# Patient Record
Sex: Female | Born: 1965 | Race: Black or African American | Hispanic: No | Smoking: Former smoker
Health system: Southern US, Community
[De-identification: ages and names within clinical notes are randomized; demographics above are authoritative.]

## PROBLEM LIST (undated history)

## (undated) DIAGNOSIS — F32A Depression, unspecified: Secondary | ICD-10-CM

## (undated) DIAGNOSIS — F419 Anxiety disorder, unspecified: Secondary | ICD-10-CM

## (undated) DIAGNOSIS — Z789 Other specified health status: Secondary | ICD-10-CM

## (undated) DIAGNOSIS — M810 Age-related osteoporosis without current pathological fracture: Secondary | ICD-10-CM

## (undated) DIAGNOSIS — E119 Type 2 diabetes mellitus without complications: Secondary | ICD-10-CM

## (undated) DIAGNOSIS — M1990 Unspecified osteoarthritis, unspecified site: Secondary | ICD-10-CM

## (undated) DIAGNOSIS — I10 Essential (primary) hypertension: Secondary | ICD-10-CM

## (undated) HISTORY — DX: Type 2 diabetes mellitus without complications: E11.9

## (undated) HISTORY — DX: Depression, unspecified: F32.A

## (undated) HISTORY — PX: TUBAL LIGATION: SHX77

## (undated) HISTORY — DX: Essential (primary) hypertension: I10

## (undated) HISTORY — DX: Age-related osteoporosis without current pathological fracture: M81.0

## (undated) HISTORY — DX: Anxiety disorder, unspecified: F41.9

---

## 2005-03-03 ENCOUNTER — Encounter: Payer: Self-pay | Admitting: Family Medicine

## 2005-03-16 ENCOUNTER — Encounter: Payer: Self-pay | Admitting: Family Medicine

## 2005-04-16 ENCOUNTER — Encounter: Payer: Self-pay | Admitting: Family Medicine

## 2009-12-24 ENCOUNTER — Emergency Department: Payer: Self-pay | Admitting: Emergency Medicine

## 2010-11-17 ENCOUNTER — Emergency Department: Payer: Self-pay | Admitting: Emergency Medicine

## 2011-01-24 ENCOUNTER — Emergency Department: Payer: Self-pay | Admitting: Emergency Medicine

## 2011-04-28 ENCOUNTER — Emergency Department: Payer: Self-pay | Admitting: Emergency Medicine

## 2011-10-12 ENCOUNTER — Emergency Department: Payer: Self-pay | Admitting: Emergency Medicine

## 2012-01-24 ENCOUNTER — Emergency Department: Payer: Self-pay | Admitting: Emergency Medicine

## 2013-08-19 ENCOUNTER — Emergency Department: Payer: Self-pay | Admitting: Emergency Medicine

## 2014-09-17 ENCOUNTER — Emergency Department: Payer: Self-pay | Admitting: Emergency Medicine

## 2014-09-17 LAB — CBC WITH DIFFERENTIAL/PLATELET
BASOS ABS: 0.1 10*3/uL (ref 0.0–0.1)
Basophil %: 0.5 %
EOS PCT: 1.6 %
Eosinophil #: 0.2 10*3/uL (ref 0.0–0.7)
HCT: 38.5 % (ref 35.0–47.0)
HGB: 12.6 g/dL (ref 12.0–16.0)
Lymphocyte #: 2.5 10*3/uL (ref 1.0–3.6)
Lymphocyte %: 22.5 %
MCH: 29.7 pg (ref 26.0–34.0)
MCHC: 32.6 g/dL (ref 32.0–36.0)
MCV: 91 fL (ref 80–100)
Monocyte #: 0.5 x10 3/mm (ref 0.2–0.9)
Monocyte %: 4.6 %
NEUTROS PCT: 70.8 %
Neutrophil #: 7.7 10*3/uL — ABNORMAL HIGH (ref 1.4–6.5)
Platelet: 204 10*3/uL (ref 150–440)
RBC: 4.22 10*6/uL (ref 3.80–5.20)
RDW: 14.7 % — ABNORMAL HIGH (ref 11.5–14.5)
WBC: 10.9 10*3/uL (ref 3.6–11.0)

## 2014-09-17 LAB — BASIC METABOLIC PANEL
ANION GAP: 7 (ref 7–16)
BUN: 6 mg/dL — ABNORMAL LOW (ref 7–18)
CREATININE: 0.83 mg/dL (ref 0.60–1.30)
Calcium, Total: 8.4 mg/dL — ABNORMAL LOW (ref 8.5–10.1)
Chloride: 109 mmol/L — ABNORMAL HIGH (ref 98–107)
Co2: 25 mmol/L (ref 21–32)
EGFR (African American): >60
Glucose: 93 mg/dL (ref 65–99)
OSMOLALITY: 279 (ref 275–301)
POTASSIUM: 4.3 mmol/L (ref 3.5–5.1)
SODIUM: 141 mmol/L (ref 136–145)

## 2014-09-17 LAB — URINALYSIS, COMPLETE
BILIRUBIN, UR: NEGATIVE
Blood: NEGATIVE
Glucose,UR: NEGATIVE mg/dL (ref 0–75)
Ketone: NEGATIVE
Leukocyte Esterase: NEGATIVE
Nitrite: NEGATIVE
PH: 7 (ref 4.5–8.0)
PROTEIN: NEGATIVE
Specific Gravity: 1.01 (ref 1.003–1.030)

## 2014-09-17 LAB — PREGNANCY, URINE: Pregnancy Test, Urine: NEGATIVE m[IU]/mL

## 2014-12-04 ENCOUNTER — Emergency Department: Payer: Self-pay | Admitting: Emergency Medicine

## 2014-12-30 ENCOUNTER — Emergency Department: Payer: Self-pay | Admitting: Student

## 2015-06-09 ENCOUNTER — Emergency Department
Admission: EM | Admit: 2015-06-09 | Discharge: 2015-06-09 | Disposition: A | Discharge status: Home / Self Care | Payer: Self-pay | Attending: Emergency Medicine | Admitting: Emergency Medicine

## 2015-06-09 ENCOUNTER — Emergency Department: Payer: Self-pay

## 2015-06-09 ENCOUNTER — Encounter: Payer: Self-pay | Admitting: Emergency Medicine

## 2015-06-09 DIAGNOSIS — J209 Acute bronchitis, unspecified: Secondary | ICD-10-CM | POA: Insufficient documentation

## 2015-06-09 DIAGNOSIS — Z720 Tobacco use: Secondary | ICD-10-CM | POA: Insufficient documentation

## 2015-06-09 DIAGNOSIS — F17200 Nicotine dependence, unspecified, uncomplicated: Secondary | ICD-10-CM

## 2015-06-09 HISTORY — DX: Other specified health status: Z78.9

## 2015-06-09 MED ORDER — IPRATROPIUM-ALBUTEROL 0.5-2.5 (3) MG/3ML IN SOLN
3.0000 mL | Freq: Once | RESPIRATORY_TRACT | Status: AC
  Administered 2015-06-09: 3 mL via RESPIRATORY_TRACT
  Filled 2015-06-09: qty 3

## 2015-06-09 MED ORDER — ALBUTEROL SULFATE HFA 108 (90 BASE) MCG/ACT IN AERS: 2.0000 | INHALATION_SPRAY | Freq: Four times a day (QID) | RESPIRATORY_TRACT | Status: DC | PRN

## 2015-06-09 MED ORDER — PREDNISONE 10 MG PO TABS: ORAL_TABLET | ORAL | Status: DC

## 2015-06-09 MED ORDER — AZITHROMYCIN 250 MG PO TABS: ORAL_TABLET | ORAL | Status: DC

## 2015-06-09 NOTE — ED Notes (Signed)
PT here with s/s of upper respiratory congestion. Has stuffy nose, congestion.  Denies fevers.  Pt reports that she has a "mold problem" at her apartment.

## 2015-06-09 NOTE — ED Notes (Signed)
Pt c/o cold symptoms, congestion, cough with green sputum, pressure in bilat sinuses. States she has mold in her apartment, and starting feeling bad two weeks ago and worsened Friday. She has taken Allegra D and alkla seltzer cold without relief.

## 2015-06-09 NOTE — ED Notes (Signed)
AAOx3.  Skin warm and dry. No SOB/ DOE.  D/C home. 

## 2015-06-09 NOTE — ED Provider Notes (Signed)
[facility] Emergency Department Provider Note  ____________________________________________  Time seen:  10:20 AM  I have reviewed the triage vital signs and the nursing notes.   HISTORY  Chief Complaint URI and Cough   HPI Denise Velez is a 49 y.o. female is here with complaint of productive cough, nasal congestion and bilateral sinus pain for approximately 2 weeks. She states that she began taking Allegra-D approximate 4 days ago and some Alka-Seltzer cold without any relief. She is unsure of any fever at home. By history she is a smoker but states that instead of smoking half pack cigarettes a day she is only smoking one or 2 cigarettes now. She denies any history of asthma or pneumonia. She is a patient currently at [facility] but has not seen them for this.She also reports that there is a "mold problem" at her apartment   Past Medical History  Diagnosis Date  . Patient denies medical problems     There are no active problems to display for this patient.   Past Surgical History  Procedure Laterality Date  . Cesarean section      Current Outpatient Rx  Name  Route  Sig  Dispense  Refill  . albuterol (PROVENTIL HFA;VENTOLIN HFA) 108 (90 BASE) MCG/ACT inhaler   Inhalation   Inhale 2 puffs into the lungs every 6 (six) hours as needed for wheezing or shortness of breath.   1 Inhaler   2   . azithromycin (ZITHROMAX Z-PAK) 250 MG tablet      Take 2 tablets (500 mg) on  Day 1,  followed by 1 tablet (250 mg) once daily on Days 2 through 5.   6 each   0   . predniSONE (DELTASONE) 10 MG tablet      Take 6 tablets  today, on day 2 take 5 tablets, day 3 take 4 tablets, day 4 take 3 tablets, day 5 take  2 tablets and 1 tablet the last day   21 tablet   0     Allergies Sulfa antibiotics  History reviewed. No pertinent family history.  Social History History  Substance Use Topics  . Smoking status: Current Every Day Smoker -- 0.50  packs/day    Types: Cigarettes  . Smokeless tobacco: Not on file  . Alcohol Use: No    Review of Systems Constitutional: Negative fever/chills Eyes: No visual changes. ENT: No sore throat. Positive for nasal drainage Cardiovascular: Denies chest pain. Respiratory: Denies shortness of breath. Positive productive cough Gastrointestinal: No abdominal pain.  No nausea, no vomiting.  No diarrhea.  No constipation. Genitourinary: Negative for dysuria. Musculoskeletal: Negative for back pain. Skin: Negative for rash. Neurological: Negative for headaches, focal weakness or numbness.  10-point ROS otherwise negative.  ____________________________________________   PHYSICAL EXAM:  VITAL SIGNS: ED Triage Vitals  Enc Vitals Group     BP 06/09/15 0858 142/84 mmHg     Pulse Rate 06/09/15 0858 92     Resp 06/09/15 0858 20     Temp 06/09/15 0858 98.1 F (36.7 C)     Temp Source 06/09/15 0858 Oral     SpO2 06/09/15 0858 98 %     Weight 06/09/15 0858 225 lb (102.059 kg)     Height 06/09/15 0858  (1.702 m)     Head Cir --      Peak Flow --      Pain Score --      Pain Loc --  Pain Edu? --      Excl. in GC? --     Constitutional: Alert and oriented. Well appearing and in no acute distress. Eyes: Conjunctivae are normal. PERRL. EOMI. Head: Atraumatic. Nose: Positive congestion/negative rhinnorhea. Mouth/Throat: Mucous membranes are moist.  Oropharynx non-erythematous. Neck: No stridor.  Supple Hematological/Lymphatic/Immunilogical: No cervical lymphadenopathy. Cardiovascular: Normal rate, regular rhythm. Grossly normal heart sounds.  Good peripheral circulation. Respiratory: Normal respiratory effort.  No retractions. Lungs CTAB with coarse cough. Gastrointestinal: Soft and nontender. No distention Musculoskeletal: No lower extremity tenderness nor edema.  No joint effusions. Neurologic:  Normal speech and language. No gross focal neurologic deficits are appreciated. No  gait instability. Skin:  Skin is warm, dry and intact. No rash noted. Psychiatric: Mood and affect are normal. Speech and behavior are normal.  ____________________________________________   LABS (all labs ordered are listed, but only abnormal results are displayed)  Labs Reviewed - No data to display    ____________________________________________  RADIOLOGY  Chest x-ray per radiologist and reviewed by me was negative for pneumonia I, Tommi Rumps, personally viewed and evaluated these images as part of my medical decision making.  ____________________________________________   PROCEDURES  Procedure(s) performed: None  Critical Care performed: No  ____________________________________________   INITIAL IMPRESSION / ASSESSMENT AND PLAN / ED COURSE  Pertinent labs & imaging results that were available during my care of the patient were reviewed by me and considered in my medical decision making (see chart for details   Patient was started on azithromycin for 5 days. She is also given a prescription for prednisone taper and an albuterol inhaler. She is encouraged to stop smoking. She is take Tylenol as needed. She is also to continue Allegra-D for congestion   FINAL CLINICAL IMPRESSION(S) / ED DIAGNOSES  Final diagnoses:  Acute bronchitis, unspecified organism  Current smoker      Tommi Rumps, PA-C 06/09/15 1800  Governor Rooks, MD 06/12/15 [phone]

## 2015-06-09 NOTE — Discharge Instructions (Signed)
Acute Bronchitis Bronchitis is when the airways that extend from the windpipe into the lungs get red, puffy, and painful (inflamed). Bronchitis often causes thick spit (mucus) to develop. This leads to a cough. A cough is the most common symptom of bronchitis. In acute bronchitis, the condition usually begins suddenly and goes away over time (usually in 2 weeks). Smoking, allergies, and asthma can make bronchitis worse. Repeated episodes of bronchitis may cause more lung problems. HOME CARE  Rest.  Drink enough fluids to keep your pee (urine) clear or pale yellow (unless you need to limit fluids as told by your doctor).  Only take over-the-counter or prescription medicines as told by your doctor.  Avoid smoking and secondhand smoke. These can make bronchitis worse. If you are a smoker, think about using nicotine gum or skin patches. Quitting smoking will help your lungs heal faster.  Reduce the chance of getting bronchitis again by:  Washing your hands often.  Avoiding people with cold symptoms.  Trying not to touch your hands to your mouth, nose, or eyes.  Follow up with your doctor as told. GET HELP IF: Your symptoms do not improve after 1 week of treatment. Symptoms include:  Cough.  Fever.  Coughing up thick spit.  Body aches.  Chest congestion.  Chills.  Shortness of breath.  Sore throat. GET HELP RIGHT AWAY IF:   You have an increased fever.  You have chills.  You have severe shortness of breath.  You have bloody thick spit (sputum).  You throw up (vomit) often.  You lose too much body fluid (dehydration).  You have a severe headache.  You faint. MAKE SURE YOU:   Understand these instructions.  Will watch your condition.  Will get help right away if you are not doing well or get worse. Document Released: 04/20/2008 Document Revised: 07/05/2013 Document Reviewed: 04/25/2013 Exit[employer] Patient Information 2015 [employer]. This information is not  intended to replace advice given to you by your health care provider. Make sure you discuss any questions you have with your health care provider.  

## 2016-06-15 ENCOUNTER — Emergency Department
Admission: EM | Admit: 2016-06-15 | Discharge: 2016-06-15 | Disposition: A | Discharge status: Home / Self Care | Payer: Self-pay | Attending: Emergency Medicine | Admitting: Emergency Medicine

## 2016-06-15 ENCOUNTER — Encounter: Payer: Self-pay | Admitting: Emergency Medicine

## 2016-06-15 ENCOUNTER — Emergency Department: Payer: Self-pay

## 2016-06-15 DIAGNOSIS — Z79899 Other long term (current) drug therapy: Secondary | ICD-10-CM | POA: Insufficient documentation

## 2016-06-15 DIAGNOSIS — Y999 Unspecified external cause status: Secondary | ICD-10-CM | POA: Insufficient documentation

## 2016-06-15 DIAGNOSIS — F17210 Nicotine dependence, cigarettes, uncomplicated: Secondary | ICD-10-CM | POA: Insufficient documentation

## 2016-06-15 DIAGNOSIS — Y929 Unspecified place or not applicable: Secondary | ICD-10-CM | POA: Insufficient documentation

## 2016-06-15 DIAGNOSIS — S46911A Strain of unspecified muscle, fascia and tendon at shoulder and upper arm level, right arm, initial encounter: Secondary | ICD-10-CM | POA: Insufficient documentation

## 2016-06-15 DIAGNOSIS — X500XXA Overexertion from strenuous movement or load, initial encounter: Secondary | ICD-10-CM | POA: Insufficient documentation

## 2016-06-15 DIAGNOSIS — Y9389 Activity, other specified: Secondary | ICD-10-CM | POA: Insufficient documentation

## 2016-06-15 MED ORDER — KETOROLAC TROMETHAMINE 60 MG/2ML IM SOLN
60.0000 mg | Freq: Once | INTRAMUSCULAR | Status: AC
  Administered 2016-06-15: 60 mg via INTRAMUSCULAR
  Filled 2016-06-15: qty 2

## 2016-06-15 MED ORDER — TRAMADOL HCL 50 MG PO TABS: 50.0000 mg | ORAL_TABLET | Freq: Four times a day (QID) | ORAL | 0 refills | Status: AC | PRN

## 2016-06-15 MED ORDER — METHOCARBAMOL 750 MG PO TABS: 1500.0000 mg | ORAL_TABLET | Freq: Four times a day (QID) | ORAL | 0 refills | Status: DC

## 2016-06-15 MED ORDER — KETOROLAC TROMETHAMINE 10 MG PO TABS: 10.0000 mg | ORAL_TABLET | Freq: Four times a day (QID) | ORAL | 0 refills | Status: DC | PRN

## 2016-06-15 NOTE — ED Notes (Signed)
Pt c/o of right shoulder pain beginning Saturday. 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   Pt reports she took 1000 mg tylenol at approx 1800

## 2016-06-15 NOTE — Discharge Instructions (Signed)
Wear arm sling for 3-5 days while awake.

## 2016-06-15 NOTE — ED Notes (Signed)
  Reviewed d/c instructions, follow-up care, and prescriptions with pt. Pt verbalized understanding 

## 2016-06-15 NOTE — ED Triage Notes (Signed)
Patient states that she has been doing some heavy lifting and developed right shoulder pain 2 days ago, that has become worse today.

## 2016-06-15 NOTE — ED Notes (Signed)
Pt reports some relief of pain when she holds arm at right angle

## 2016-06-15 NOTE — ED Provider Notes (Signed)
[facility] Emergency Department Provider Note   ____________________________________________   First MD Initiated Contact with Patient 06/15/16 2031     (approximate)  I have reviewed the triage vital signs and the nursing notes.   HISTORY  Chief Complaint Shoulder Pain    HPI Denise Velez is a 50 y.o. female patient complaining of posterior upper shoulder painfor 2 days. 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 Patient states she felt a pull in the right posterior shoulder 2 days ago and pain is worsened today. Patient has used icy hot and Tylenol with no measurable fever relief. Patient denies any loss of sensation. Patient state increased pain with adduction and overhead reaching. Patient rates the pain as a 9/10. Patient described a pain as aching intermittently episodes of "sharp". No other palliative measures for this complaint. Patient is right-hand dominant.   Past Medical History:  Diagnosis Date  . Patient denies medical problems     There are no active problems to display for this patient.   Past Surgical History:  Procedure Laterality Date  . CESAREAN SECTION     times 2    Prior to Admission medications   Medication Sig Start Date End Date Taking? Authorizing Provider  albuterol (PROVENTIL HFA;VENTOLIN HFA) 108 (90 BASE) MCG/ACT inhaler Inhale 2 puffs into the lungs every 6 (six) hours as needed for wheezing or shortness of breath. 06/09/15   Tommi Rumps, PA-C  azithromycin (ZITHROMAX Z-PAK) 250 MG tablet Take 2 tablets (500 mg) on  Day 1,  followed by 1 tablet (250 mg) once daily on Days 2 through 5. 06/09/15   Tommi Rumps, PA-C  ketorolac (TORADOL) 10 MG tablet Take 1 tablet (10 mg total) by mouth every 6 (six) hours as needed. 06/15/16   Joni Reining, PA-C  methocarbamol (ROBAXIN-750) 750 MG tablet Take 2 tablets (1,500 mg total) by mouth 4 (four) times daily. 06/15/16   Joni Reining, PA-C  predniSONE (DELTASONE) 10 MG tablet Take 6 tablets  today, on day 2 take 5 tablets, day 3 take 4 tablets, day 4 take 3 tablets, day 5 take  2 tablets and 1 tablet the last day 06/09/15   Tommi Rumps, PA-C  traMADol (ULTRAM) 50 MG tablet Take 1 tablet (50 mg total) by mouth every 6 (six) hours as needed. 06/15/16 06/15/17  Joni Reining, PA-C    Allergies Sulfa antibiotics  No family history on file.  Social History Social History  Substance Use Topics  . Smoking status: Current Every Day Smoker    Packs/day: 0.50    Types: Cigarettes  . Smokeless tobacco: Never Used  . Alcohol use Yes     Comment: occasionaly    Review of Systems Constitutional: No fever/chills Eyes: No visual changes. ENT: No sore throat. Cardiovascular: Denies chest pain. Respiratory: Denies shortness of breath. Gastrointestinal: No abdominal pain.  No nausea, no vomiting.  No diarrhea.  No constipation. Genitourinary: Negative for dysuria. Musculoskeletal: Positive right posterior shoulder Skin: Negative for rash. Neurological: Negative for headaches, focal weakness or numbness. Allergic/Immunilogical: Sulfa antibiotics   ____________________________________________   PHYSICAL EXAM:  VITAL SIGNS: ED Triage Vitals  Enc Vitals Group     BP 06/15/16 1920 (!) 166/99     Pulse Rate 06/15/16 1920 92     Resp 06/15/16 1920 18     Temp 06/15/16 1920 98.6 F (37 C)     Temp Source  06/15/16 1920 Oral     SpO2 06/15/16 1920 98 %     Weight 06/15/16 1920 225 lb (102.1 kg)     Height 06/15/16 1920  (1.702 m)     Head Circumference --      Peak Flow --      Pain Score 06/15/16 1925 8     Pain Loc --      Pain Edu? --      Excl. in GC? --     Constitutional: Alert and oriented. Well appearing and in no acute distress. Eyes: Conjunctivae are normal. PERRL. EOMI. Head: Atraumatic. Nose: No congestion/rhinnorhea. Mouth/Throat: Mucous membranes are moist.  Oropharynx  non-erythematous. Neck: No stridor.  No cervical spine tenderness to palpation. Hematological/Lymphatic/Immunilogical: No cervical lymphadenopathy. Cardiovascular: Normal rate, regular rhythm. Grossly normal heart sounds.  Good peripheral circulation. Respiratory: Normal respiratory effort.  No retractions. Lungs CTAB. Gastrointestinal: Soft and nontender. No distention. No abdominal bruits. No CVA tenderness. Musculoskeletal: No obvious deformity of the right upper extremity. Patient holds the right upper extremity  Inadduction.  Patient has some moderate guarding palpation of right superior scapular area.   Neurologic:  Normal speech and language. No gross focal neurologic deficits are appreciated. No gait instability. Skin:  Skin is warm, dry and intact. No rash noted. Psychiatric: Mood and affect are normal. Speech and behavior are normal.  ____________________________________________   LABS (all labs ordered are listed, but only abnormal results are displayed)  Labs Reviewed - No data to display ____________________________________________  EKG   ____________________________________________  RADIOLOGY   ____________________________________________   PROCEDURES  Procedure(s) performed: None  Procedures  Critical Care performed: No  ____________________________________________   INITIAL IMPRESSION / ASSESSMENT AND PLAN / ED COURSE  Pertinent labs & imaging results that were available during my care of the patient were reviewed by me and considered in my medical decision making (see chart for details).  Right scapular muscle strain. Patient given discharge care instructions. Patient placed in an arm sling. Patient given a prescription for tramadol, Toradol, and Robaxin. Patient advised to follow-up with PCP if no improvement in 3 days.  Clinical Course     ____________________________________________   FINAL CLINICAL IMPRESSION(S) / ED DIAGNOSES  Final  diagnoses:  Muscle strain of right scapular region, initial encounter      NEW MEDICATIONS STARTED DURING THIS VISIT:  New Prescriptions   KETOROLAC (TORADOL) 10 MG TABLET    Take 1 tablet (10 mg total) by mouth every 6 (six) hours as needed.   METHOCARBAMOL (ROBAXIN-750) 750 MG TABLET    Take 2 tablets (1,500 mg total) by mouth 4 (four) times daily.   TRAMADOL (ULTRAM) 50 MG TABLET    Take 1 tablet (50 mg total) by mouth every 6 (six) hours as needed.     Note:  This document was prepared using Dragon voice recognition software and may include unintentional dictation errors.    Joni Reining, PA-C 06/15/16 2045    Loleta Rose, MD 06/16/16 [phone]

## 2017-12-12 ENCOUNTER — Other Ambulatory Visit: Payer: Self-pay

## 2017-12-12 ENCOUNTER — Encounter: Payer: Self-pay | Admitting: Emergency Medicine

## 2017-12-12 ENCOUNTER — Emergency Department
Admission: EM | Admit: 2017-12-12 | Discharge: 2017-12-12 | Disposition: A | Discharge status: Home / Self Care | Payer: Self-pay | Attending: Emergency Medicine | Admitting: Emergency Medicine

## 2017-12-12 DIAGNOSIS — J029 Acute pharyngitis, unspecified: Secondary | ICD-10-CM | POA: Insufficient documentation

## 2017-12-12 DIAGNOSIS — Z79899 Other long term (current) drug therapy: Secondary | ICD-10-CM | POA: Insufficient documentation

## 2017-12-12 DIAGNOSIS — T7840XA Allergy, unspecified, initial encounter: Secondary | ICD-10-CM | POA: Insufficient documentation

## 2017-12-12 DIAGNOSIS — F17210 Nicotine dependence, cigarettes, uncomplicated: Secondary | ICD-10-CM | POA: Insufficient documentation

## 2017-12-12 MED ORDER — CETIRIZINE HCL 10 MG PO TABS: 10.0000 mg | ORAL_TABLET | Freq: Every day | ORAL | 0 refills | Status: DC

## 2017-12-12 MED ORDER — FAMOTIDINE 20 MG PO TABS
20.0000 mg | ORAL_TABLET | Freq: Once | ORAL | Status: AC
  Administered 2017-12-12: 20 mg via ORAL
  Filled 2017-12-12: qty 1

## 2017-12-12 MED ORDER — PREDNISONE 10 MG PO TABS: 10.0000 mg | ORAL_TABLET | Freq: Every day | ORAL | 0 refills | Status: DC

## 2017-12-12 MED ORDER — DEXAMETHASONE SODIUM PHOSPHATE 10 MG/ML IJ SOLN
10.0000 mg | Freq: Once | INTRAMUSCULAR | Status: AC
  Administered 2017-12-12: 10 mg via INTRAMUSCULAR
  Filled 2017-12-12: qty 1

## 2017-12-12 NOTE — Discharge Instructions (Signed)
Please take Zyrtec daily as needed.  If more severe symptoms such as rash, itching, start 6-day steroid taper.  Follow-up with primary care provider in 3-4 days for recheck.  Return to the ER for any shortness of breath, wheezing, facial swelling, difficulty swallowing, worsening symptoms or urgent changes in health.

## 2017-12-12 NOTE — ED Provider Notes (Signed)
[facility] EMERGENCY DEPARTMENT Provider Note   CSN: [id number] Arrival date & time: 12/12/17  4098     History   Chief Complaint Chief Complaint  Patient presents with  . Sore Throat  . Cough    HPI Denise Velez is a 52 y.o. female presents to the emergency department for evaluation of hoarseness, mild sore throat.  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  She has had similar symptoms with cats in the past.  This morning upon awakening she noted her voice was hoarse with a mild sore throat.  She denies any facial swelling, shortness of breath, difficulty swallowing.  No history of anaphylaxis.  She took 3 Benadryl last night which seemed to help some of her symptoms.  HPI  Past Medical History:  Diagnosis Date  . Patient denies medical problems     There are no active problems to display for this patient.   Past Surgical History:  Procedure Laterality Date  . CESAREAN SECTION     times 2    OB History    No data available       Home Medications    Prior to Admission medications   Medication Sig Start Date End Date Taking? Authorizing Provider  albuterol (PROVENTIL HFA;VENTOLIN HFA) 108 (90 BASE) MCG/ACT inhaler Inhale 2 puffs into the lungs every 6 (six) hours as needed for wheezing or shortness of breath. 06/09/15   Bridget Hartshorn L, PA-C  azithromycin (ZITHROMAX Z-PAK) 250 MG tablet Take 2 tablets (500 mg) on  Day 1,  followed by 1 tablet (250 mg) once daily on Days 2 through 5. 06/09/15   Tommi Rumps, PA-C  cetirizine (ZYRTEC ALLERGY) 10 MG tablet Take 1 tablet (10 mg total) by mouth daily. 12/12/17   Evon Slack, PA-C  ketorolac (TORADOL) 10 MG tablet Take 1 tablet (10 mg total) by mouth every 6 (six) hours as needed. 06/15/16   Joni Reining, PA-C  methocarbamol (ROBAXIN-750) 750 MG tablet Take 2 tablets (1,500 mg total) by mouth 4 (four) times daily.  06/15/16   Joni Reining, PA-C  predniSONE (DELTASONE) 10 MG tablet Take 1 tablet (10 mg total) by mouth daily. 6,5,4,3,2,1 six day taper 12/12/17   Evon Slack, PA-C    Family History No family history on file.  Social History Social History   Tobacco Use  . Smoking status: Current Every Day Smoker    Packs/day: 0.50    Types: Cigarettes  . Smokeless tobacco: Never Used  Substance Use Topics  . Alcohol use: Yes    Comment: occasionaly  . Drug use: No     Allergies   Sulfa antibiotics   Review of Systems Review of Systems  Constitutional: Negative for chills and fever.  HENT: Positive for congestion and rhinorrhea. Negative for facial swelling, sinus pressure, sinus pain, sore throat, trouble swallowing and voice change.   Eyes: Positive for itching. Negative for pain.  Respiratory: Negative for cough and shortness of breath.   Cardiovascular: Negative for chest pain and leg swelling.  Gastrointestinal: Negative for nausea and vomiting.  Musculoskeletal: Negative for back pain and neck pain.  Neurological: Negative for dizziness and headaches.     Physical Exam Updated Vital Signs BP (!) 176/80 (BP Location: Left Arm)   Pulse 87   Temp 98.2 F (36.8 C) (Oral)   Resp 18   Wt 102.1 kg (225  lb)   SpO2 98%   BMI 35.24 kg/m   Physical Exam  Constitutional: She is oriented to person, place, and time. She appears well-developed and well-nourished.  HENT:  Head: Normocephalic and atraumatic.  Mouth/Throat: Uvula is midline, oropharynx is clear and moist and mucous membranes are normal. No oral lesions. No uvula swelling. No oropharyngeal exudate, posterior oropharyngeal edema, posterior oropharyngeal erythema or tonsillar abscesses. Tonsils are 0 on the right. Tonsils are 0 on the left. No tonsillar exudate.  No signs of facial swelling or angioedema.  Eyes: Conjunctivae and EOM are normal.  Neck: Normal range of motion.  Cardiovascular: Normal rate and normal  heart sounds.  Pulmonary/Chest: Effort normal. No respiratory distress.  Musculoskeletal: Normal range of motion.  Lymphadenopathy:    She has no cervical adenopathy.  Neurological: She is alert and oriented to person, place, and time.  Skin: Skin is warm. No rash noted. No erythema.  Psychiatric: She has a normal mood and affect. Her behavior is normal. Thought content normal.     ED Treatments / Results  Labs (all labs ordered are listed, but only abnormal results are displayed) Labs Reviewed - No data to display  EKG  EKG Interpretation None       Radiology No results found.  Procedures Procedures (including critical care time)  Medications Ordered in ED Medications  dexamethasone (DECADRON) injection 10 mg (10 mg Intramuscular Given 12/12/17 1101)  famotidine (PEPCID) tablet 20 mg (20 mg Oral Given 12/12/17 1101)     Initial Impression / Assessment and Plan / ED Course  I have reviewed the triage vital signs and the nursing notes.  Pertinent labs & imaging results that were available during my care of the patient were reviewed by me and considered in my medical decision making (see chart for details).     52 year old female with history of itchy watery eyes after exposure to cats.  Symptoms resolved with Benadryl.  Patient woke up this morning with sore throat and mild hoarseness.  No angioedema, shortness of breath, painful or trouble swallowing.  She was given 10mg  of dexamethasone and H1 blocker here in the emergency department.  Symptoms improved.  She is encouraged to go home, take Zyrtec daily.  She will avoid exposure to cats.  She is educated on signs and symptoms to return to the ED for.  Final Clinical Impressions(s) / ED Diagnoses   Final diagnoses:  Allergic reaction, initial encounter    ED Discharge Orders        Ordered    predniSONE (DELTASONE) 10 MG tablet  Daily     12/12/17 1101    cetirizine (ZYRTEC ALLERGY) 10 MG tablet  Daily      12/12/17 1101       Ronnette JuniperGaines, Thomas C, PA-C 12/12/17 1126    Arnaldo NatalMalinda, Paul F, MD 12/22/17 2352

## 2017-12-12 NOTE — ED Notes (Signed)
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

## 2017-12-12 NOTE — ED Triage Notes (Signed)
Pt with sore throat and cough. May be allergic to cat that her client has.

## 2018-01-06 ENCOUNTER — Emergency Department
Admission: EM | Admit: 2018-01-06 | Discharge: 2018-01-06 | Disposition: A | Discharge status: Home / Self Care | Payer: Self-pay | Attending: Emergency Medicine | Admitting: Emergency Medicine

## 2018-01-06 ENCOUNTER — Emergency Department: Payer: Self-pay

## 2018-01-06 ENCOUNTER — Encounter: Payer: Self-pay | Admitting: Emergency Medicine

## 2018-01-06 DIAGNOSIS — F17210 Nicotine dependence, cigarettes, uncomplicated: Secondary | ICD-10-CM | POA: Insufficient documentation

## 2018-01-06 DIAGNOSIS — Z79899 Other long term (current) drug therapy: Secondary | ICD-10-CM | POA: Insufficient documentation

## 2018-01-06 DIAGNOSIS — J209 Acute bronchitis, unspecified: Secondary | ICD-10-CM | POA: Insufficient documentation

## 2018-01-06 MED ORDER — PREDNISONE 50 MG PO TABS: ORAL_TABLET | ORAL | 0 refills | Status: DC

## 2018-01-06 MED ORDER — AZITHROMYCIN 250 MG PO TABS: ORAL_TABLET | ORAL | 0 refills | Status: AC

## 2018-01-06 NOTE — ED Notes (Signed)
Productive cough and flu like symptoms since Saturday. granddaughter has whopping cough.

## 2018-01-06 NOTE — ED Triage Notes (Signed)
Cough and congestion since Sat, recently in contact with whooping cough. Denies fever.

## 2018-01-06 NOTE — ED Provider Notes (Signed)
[facility] Emergency Department Provider Note  ____________________________________________  Time seen: Approximately 9:04 PM  I have reviewed the triage vital signs and the nursing notes.   HISTORY  Chief Complaint Cough and Nasal Congestion    HPI Denise Velez is a 52 y.o. female presents to the emergency department with productive cough for green sputum production for the past 5 days.  Patient reports intermittent shortness of breath but no chest tightness or chest pain.  She denies rhinorrhea.  She has been afebrile.  She denies fatigue.  She denies known contacts who have been diagnosed with pertussis.  Triage note noted.  No alleviating measures have been attempted.   Past Medical History:  Diagnosis Date  . Patient denies medical problems     There are no active problems to display for this patient.   Past Surgical History:  Procedure Laterality Date  . CESAREAN SECTION     times 2    Prior to Admission medications   Medication Sig Start Date End Date Taking? Authorizing Provider  albuterol (PROVENTIL HFA;VENTOLIN HFA) 108 (90 BASE) MCG/ACT inhaler Inhale 2 puffs into the lungs every 6 (six) hours as needed for wheezing or shortness of breath. 06/09/15   Bridget HartshornSummers, Rhonda L, PA-C  azithromycin (ZITHROMAX Z-PAK) 250 MG tablet Take 2 tablets (500 mg) on  Day 1,  followed by 1 tablet (250 mg) once daily on Days 2 through 5. 01/06/18 01/11/18  Orvil FeilWoods, Emmalyn Hinson M, PA-C  cetirizine (ZYRTEC ALLERGY) 10 MG tablet Take 1 tablet (10 mg total) by mouth daily. 12/12/17   Evon SlackGaines, Thomas C, PA-C  ketorolac (TORADOL) 10 MG tablet Take 1 tablet (10 mg total) by mouth every 6 (six) hours as needed. 06/15/16   Joni ReiningSmith, Ronald K, PA-C  methocarbamol (ROBAXIN-750) 750 MG tablet Take 2 tablets (1,500 mg total) by mouth 4 (four) times daily. 06/15/16   Joni ReiningSmith, Ronald K, PA-C  predniSONE (DELTASONE) 50 MG tablet Take one 50 mg tablet once daily for the next five days. 01/06/18    Orvil FeilWoods, Mildreth Reek M, PA-C    Allergies Sulfa antibiotics  No family history on file.  Social History Social History   Tobacco Use  . Smoking status: Current Every Day Smoker    Packs/day: 0.50    Types: Cigarettes  . Smokeless tobacco: Never Used  Substance Use Topics  . Alcohol use: Yes    Comment: occasionaly  . Drug use: No     Review of Systems  Constitutional: No fever/chills Eyes: No visual changes. No discharge ENT: No upper respiratory complaints. Cardiovascular: no chest pain. Respiratory: Patient has productive cough and intermittent shortness of breath. Gastrointestinal: No abdominal pain.  No nausea, no vomiting.  No diarrhea.  No constipation. Musculoskeletal: Negative for musculoskeletal pain. Skin: Negative for rash, abrasions, lacerations, ecchymosis. Neurological: Negative for headaches, focal weakness or numbness.   ____________________________________________   PHYSICAL EXAM:  VITAL SIGNS: ED Triage Vitals  Enc Vitals Group     BP 01/06/18 1841 (!) 162/83     Pulse Rate 01/06/18 1841 79     Resp 01/06/18 1841 18     Temp 01/06/18 1841 97.9 F (36.6 C)     Temp Source 01/06/18 1841 Oral     SpO2 01/06/18 1841 100 %     Weight 01/06/18 1841 225 lb (102.1 kg)     Height 01/06/18 1841 5\' 8"  (1.727 m)     Head Circumference --      Peak Flow --  Pain Score 01/06/18 1839 7     Pain Loc --      Pain Edu? --      Excl. in GC? --      Constitutional: Alert and oriented. Well appearing and in no acute distress. Eyes: Conjunctivae are normal. PERRL. EOMI. Head: Atraumatic. ENT:      Ears: TMs are pearly      Nose: No congestion/rhinnorhea.      Mouth/Throat: Mucous membranes are moist.  Hematological/Lymphatic/Immunilogical: No cervical lymphadenopathy. Cardiovascular: Normal rate, regular rhythm. Normal S1 and S2.  Good peripheral circulation. Respiratory: Normal respiratory effort without tachypnea or retractions. Lungs CTAB. Good air  entry to the bases with no decreased or absent breath sounds. Musculoskeletal: Full range of motion to all extremities. No gross deformities appreciated. Neurologic:  Normal speech and language. No gross focal neurologic deficits are appreciated.  Skin:  Skin is warm, dry and intact. No rash noted. Psychiatric: Mood and affect are normal. Speech and behavior are normal. Patient exhibits appropriate insight and judgement.   ____________________________________________   LABS (all labs ordered are listed, but only abnormal results are displayed)  Labs Reviewed - No data to display ____________________________________________  EKG   ____________________________________________  RADIOLOGY Geraldo Pitter, personally viewed and evaluated these images (plain radiographs) as part of my medical decision making, as well as reviewing the written report by the radiologist.  Dg Chest 2 View  Result Date: 01/06/2018 CLINICAL DATA:  Cough and congestion five days. EXAM: CHEST  2 VIEW COMPARISON:  06/09/2015 FINDINGS: Lungs are clear. Cardiomediastinal silhouette and remainder the exam is unchanged. IMPRESSION: No active cardiopulmonary disease. Electronically Signed   By: Elberta Fortis M.D.   On: 01/06/2018 19:52    ____________________________________________    PROCEDURES  Procedure(s) performed:    Procedures    Medications - No data to display   ____________________________________________   INITIAL IMPRESSION / ASSESSMENT AND PLAN / ED COURSE  Pertinent labs & imaging results that were available during my care of the patient were reviewed by me and considered in my medical decision making (see chart for details).  Review of the [place] CSRS was performed in accordance of the [facility] prior to dispensing any controlled drugs.     Assessment and plan Acute bronchitis Patient presents to the emergency department with productive cough for green sputum production for 5 days.   Differential diagnosis included acute bronchitis versus community-acquired pneumonia.  No consolidations were visualized on chest x-ray.  Patient was treated empirically with azithromycin and prednisone.  Patient was advised to follow-up with primary care as needed.  All patient questions were answered.    ____________________________________________  FINAL CLINICAL IMPRESSION(S) / ED DIAGNOSES  Final diagnoses:  Acute bronchitis, unspecified organism      NEW MEDICATIONS STARTED DURING THIS VISIT:  ED Discharge Orders        Ordered    azithromycin (ZITHROMAX Z-PAK) 250 MG tablet     01/06/18 2015    predniSONE (DELTASONE) 50 MG tablet     01/06/18 2015          This chart was dictated using voice recognition software/Dragon. Despite best efforts to proofread, errors can occur which can change the meaning. Any change was purely unintentional.    Gasper Lloyd 01/06/18 2106    Myrna Blazer, MD 01/06/18 [phone]

## 2019-05-13 ENCOUNTER — Emergency Department: Payer: Self-pay

## 2019-05-13 ENCOUNTER — Encounter: Payer: Self-pay | Admitting: Emergency Medicine

## 2019-05-13 ENCOUNTER — Other Ambulatory Visit: Payer: Self-pay

## 2019-05-13 ENCOUNTER — Emergency Department
Admission: EM | Admit: 2019-05-13 | Discharge: 2019-05-13 | Disposition: A | Discharge status: Home / Self Care | Payer: Self-pay | Attending: Emergency Medicine | Admitting: Emergency Medicine

## 2019-05-13 DIAGNOSIS — R079 Chest pain, unspecified: Secondary | ICD-10-CM | POA: Insufficient documentation

## 2019-05-13 DIAGNOSIS — Z5321 Procedure and treatment not carried out due to patient leaving prior to being seen by health care provider: Secondary | ICD-10-CM | POA: Insufficient documentation

## 2019-05-13 LAB — BASIC METABOLIC PANEL
Anion gap: 9 (ref 5–15)
BUN: 7 mg/dL (ref 6–20)
CO2: 22 mmol/L (ref 22–32)
Calcium: 9 mg/dL (ref 8.9–10.3)
Chloride: 107 mmol/L (ref 98–111)
Creatinine, Ser: 0.62 mg/dL (ref 0.44–1.00)
GFR calc Af Amer: >60 mL/min (ref >60)
GFR calc non Af Amer: >60 mL/min (ref >60)
Glucose, Bld: 151 mg/dL — ABNORMAL HIGH (ref 70–99)
Potassium: 3.6 mmol/L (ref 3.5–5.1)
Sodium: 138 mmol/L (ref 135–145)

## 2019-05-13 LAB — CBC
HCT: 36 % (ref 36.0–46.0)
Hemoglobin: 11.6 g/dL — ABNORMAL LOW (ref 12.0–15.0)
MCH: 28.3 pg (ref 26.0–34.0)
MCHC: 32.2 g/dL (ref 30.0–36.0)
MCV: 87.8 fL (ref 80.0–100.0)
Platelets: 299 10*3/uL (ref 150–400)
RBC: 4.1 MIL/uL (ref 3.87–5.11)
RDW: 15.4 % (ref 11.5–15.5)
WBC: 12.4 10*3/uL — ABNORMAL HIGH (ref 4.0–10.5)
nRBC: 0 % (ref 0.0–0.2)

## 2019-05-13 LAB — TROPONIN I (HIGH SENSITIVITY): Troponin I (High Sensitivity): <2 ng/L (ref <18)

## 2019-05-13 NOTE — ED Triage Notes (Addendum)
Pt arrived via POV with chest pain that started around 11-1130am, pt states she was laying down when her brother called her to take her to the store, pt states she felt lightheaded while bathing. Pt states she has been under a lot of stress at home and mother recently died while getting a pacemaker placed.  Pt states the chest pain was sharp intermittently. Pt denies any chest pain at this time. Pt reports she had difficulty catching her breath when the pain happened and broke out in a sweat as well.  Denies nausea or back pain.  Pt tearful in triage, states she has not been eating or sleeping well and cries a lot. Pt states her family is causing her a lot of stress. 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

## 2019-05-15 ENCOUNTER — Telehealth: Payer: Self-pay | Admitting: Emergency Medicine

## 2019-05-15 NOTE — Telephone Encounter (Signed)
Called patient due to lwot to inquire about condition and follow up plans. Left message.   

## 2019-07-31 ENCOUNTER — Ambulatory Visit: Payer: Self-pay

## 2019-08-12 ENCOUNTER — Other Ambulatory Visit: Payer: Self-pay

## 2019-08-12 DIAGNOSIS — Z20822 Contact with and (suspected) exposure to covid-19: Secondary | ICD-10-CM

## 2019-08-13 LAB — NOVEL CORONAVIRUS, NAA: SARS-CoV-2, NAA: NOT DETECTED

## 2020-04-05 ENCOUNTER — Ambulatory Visit (LOCAL_COMMUNITY_HEALTH_CENTER): Payer: Self-pay

## 2020-04-05 ENCOUNTER — Other Ambulatory Visit: Payer: Self-pay

## 2020-04-05 DIAGNOSIS — Z111 Encounter for screening for respiratory tuberculosis: Secondary | ICD-10-CM

## 2020-04-08 ENCOUNTER — Other Ambulatory Visit: Payer: Self-pay

## 2020-04-08 ENCOUNTER — Ambulatory Visit (LOCAL_COMMUNITY_HEALTH_CENTER): Payer: Self-pay

## 2020-04-08 DIAGNOSIS — Z111 Encounter for screening for respiratory tuberculosis: Secondary | ICD-10-CM

## 2020-04-08 LAB — TB SKIN TEST
Induration: 0 mm
TB Skin Test: NEGATIVE

## 2020-04-10 ENCOUNTER — Other Ambulatory Visit: Payer: Self-pay

## 2020-04-12 ENCOUNTER — Other Ambulatory Visit: Payer: Self-pay

## 2020-10-03 ENCOUNTER — Emergency Department: Payer: Worker's Compensation

## 2020-10-03 ENCOUNTER — Emergency Department
Admission: EM | Admit: 2020-10-03 | Discharge: 2020-10-03 | Disposition: A | Discharge status: Home / Self Care | Payer: Worker's Compensation | Attending: Emergency Medicine | Admitting: Emergency Medicine

## 2020-10-03 ENCOUNTER — Other Ambulatory Visit: Payer: Self-pay

## 2020-10-03 DIAGNOSIS — X509XXA Other and unspecified overexertion or strenuous movements or postures, initial encounter: Secondary | ICD-10-CM | POA: Diagnosis not present

## 2020-10-03 DIAGNOSIS — F17210 Nicotine dependence, cigarettes, uncomplicated: Secondary | ICD-10-CM | POA: Diagnosis not present

## 2020-10-03 DIAGNOSIS — Y9269 Other specified industrial and construction area as the place of occurrence of the external cause: Secondary | ICD-10-CM | POA: Insufficient documentation

## 2020-10-03 DIAGNOSIS — M25561 Pain in right knee: Secondary | ICD-10-CM | POA: Diagnosis present

## 2020-10-03 MED ORDER — ACETAMINOPHEN 325 MG PO TABS
650.0000 mg | ORAL_TABLET | Freq: Once | ORAL | Status: AC
  Administered 2020-10-03: 650 mg via ORAL
  Filled 2020-10-03: qty 2

## 2020-10-03 MED ORDER — TRAMADOL HCL 50 MG PO TABS: 50.0000 mg | ORAL_TABLET | Freq: Four times a day (QID) | ORAL | 0 refills | Status: AC | PRN

## 2020-10-03 MED ORDER — ONDANSETRON 4 MG PO TBDP: 4.0000 mg | ORAL_TABLET | Freq: Three times a day (TID) | ORAL | 0 refills | Status: AC | PRN

## 2020-10-03 NOTE — ED Notes (Signed)
Crutches given with return demo by pt. Ace wrap applied to knee with return demonstration.

## 2020-10-03 NOTE — ED Provider Notes (Signed)
Emergency Department Provider Note  ____________________________________________  Time seen: Approximately 11:02 PM  I have reviewed the triage vital signs and the nursing notes.   HISTORY  Chief Complaint Knee Injury   Historian Patient    HPI Denise Velez is a 54 y.o. female presents to the emergency department with acute right knee pain.  Patient states that she twisted awkwardly and states that she has not been able to bear weight since injury occurred.  No numbness or tingling in the right lower extremity.  Patient did not actually physically fall.  No chest pain, chest tightness or abdominal pain.  No other alleviating measures have been attempted.   Past Medical History:  Diagnosis Date   Patient denies medical problems      Immunizations up to date:  Yes.     Past Medical History:  Diagnosis Date   Patient denies medical problems     There are no problems to display for this patient.   Past Surgical History:  Procedure Laterality Date   CESAREAN SECTION     times 2    Prior to Admission medications   Medication Sig Start Date End Date Taking? Authorizing Provider  albuterol (PROVENTIL HFA;VENTOLIN HFA) 108 (90 BASE) MCG/ACT inhaler Inhale 2 puffs into the lungs every 6 (six) hours as needed for wheezing or shortness of breath. 06/09/15   Tommi Rumps, PA-C  cetirizine (ZYRTEC ALLERGY) 10 MG tablet Take 1 tablet (10 mg total) by mouth daily. 12/12/17   Evon Slack, PA-C  ketorolac (TORADOL) 10 MG tablet Take 1 tablet (10 mg total) by mouth every 6 (six) hours as needed. 06/15/16   Joni Reining, PA-C  methocarbamol (ROBAXIN-750) 750 MG tablet Take 2 tablets (1,500 mg total) by mouth 4 (four) times daily. 06/15/16   Joni Reining, PA-C  ondansetron (ZOFRAN ODT) 4 MG disintegrating tablet Take 1 tablet (4 mg total) by mouth every 8 (eight) hours as needed for up to 5 days. 10/03/20 10/08/20  Orvil Feil, PA-C  predniSONE (DELTASONE) 50 MG  tablet Take one 50 mg tablet once daily for the next five days. 01/06/18   Orvil Feil, PA-C  traMADol (ULTRAM) 50 MG tablet Take 1 tablet (50 mg total) by mouth every 6 (six) hours as needed for up to 3 days. 10/03/20 10/06/20  Orvil Feil, PA-C    Allergies Sulfa antibiotics  No family history on file.  Social History Social History   Tobacco Use   Smoking status: Current Every Day Smoker    Packs/day: 0.25    Types: Cigarettes   Smokeless tobacco: Never Used  Vaping Use   Vaping Use: Never used  Substance Use Topics   Alcohol use: Yes    Comment: occasionaly   Drug use: No     Review of Systems  Constitutional: No fever/chills Eyes:  No discharge ENT: No upper respiratory complaints. Respiratory: no cough. No SOB/ use of accessory muscles to breath Gastrointestinal:   No nausea, no vomiting.  No diarrhea.  No constipation. Musculoskeletal: Patient has right knee pain.  Skin: Negative for rash, abrasions, lacerations, ecchymosis.    ____________________________________________   PHYSICAL EXAM:  VITAL SIGNS: ED Triage Vitals [10/03/20 2024]  Enc Vitals Group     BP (!) 183/106     Pulse Rate 87     Resp 18     Temp 98.9 F (37.2 C)     Temp Source Oral     SpO2 98 %  Weight 240 lb (108.9 kg)     Height 5\' 8"  (1.727 m)     Head Circumference      Peak Flow      Pain Score 8     Pain Loc      Pain Edu?      Excl. in GC?      Constitutional: Alert and oriented. Well appearing and in no acute distress. Eyes: Conjunctivae are normal. PERRL. EOMI. Head: Atraumatic. Cardiovascular: Normal rate, regular rhythm. Normal S1 and S2.  Good peripheral circulation. Respiratory: Normal respiratory effort without tachypnea or retractions. Lungs CTAB. Good air entry to the bases with no decreased or absent breath sounds Gastrointestinal: Bowel sounds x 4 quadrants. Soft and nontender to palpation. No guarding or rigidity. No  distention. Musculoskeletal: Full range of motion to all extremities. No obvious deformities noted Neurologic:  Normal for age. No gross focal neurologic deficits are appreciated.  Skin:  Skin is warm, dry and intact. No rash noted. Psychiatric: Mood and affect are normal for age. Speech and behavior are normal.   ____________________________________________   LABS (all labs ordered are listed, but only abnormal results are displayed)  Labs Reviewed - No data to display ____________________________________________  EKG   ____________________________________________  RADIOLOGY , personally viewed and evaluated these images (plain radiographs) as part of my medical decision making, as well as reviewing the written report by the radiologist.  DG Knee Complete 4 Views Right  Result Date: 10/03/2020 CLINICAL DATA:  Twisting injury of the right knee EXAM: RIGHT KNEE - COMPLETE 4+ VIEW COMPARISON:  None. FINDINGS: Moderate suprapatellar joint effusion. Questionable deep lateral sulcus sign which could reflect an impaction or osteochondral type injury along the superior-most articular surface of the lateral femoral condyle. No other acute osseous abnormality is seen. Mild tricompartmental degenerative changes. Minimal stranding is seen anterior to the patellar tendon albeit without significant thickening or discontinuity possibly related to patellar enthesopathic changes with bidirectional anterior patellar spurs. IMPRESSION: 1. Moderate suprapatellar joint effusion 2. Possible deep lateral sulcus sign which could reflect an impaction or osteochondral type injury along the superior-most articular surface of the lateral femoral condyle. 3. Mild tricompartmental degenerative changes. 4. Minimal stranding anterior to the patellar tendon albeit without significant thickening or discontinuity. Possibly related to enthesopathy along the extensor mechanism, correlate for point tenderness.  Electronically Signed   By: 10/05/2020 M.D.   On: 10/03/2020 20:53    ____________________________________________    PROCEDURES  Procedure(s) performed:     Procedures     Medications  acetaminophen (TYLENOL) tablet 650 mg (650 mg Oral Given 10/03/20 2301)     ____________________________________________   INITIAL IMPRESSION / ASSESSMENT AND PLAN / ED COURSE  Pertinent labs & imaging results that were available during my care of the patient were reviewed by me and considered in my medical decision making (see chart for details).      Assessment and Plan: Right knee pain: 54 year old female presents to the ED with acute right knee pain after a twisting type injury.  Patient has deep sulcus sign concerning for a possible osteochondral defect.  Patient was given a Ace wrap over right knee as we did not have fitting knee immobilizer in the emergency department.  She was discharged with a short course of tramadol and crutches and was advised to follow-up with orthopedics, Dr. 44.  Return precautions were given to return with new or worsening symptoms.      ____________________________________________  FINAL CLINICAL IMPRESSION(S) / ED DIAGNOSES  Final diagnoses:  Acute pain of right knee      NEW MEDICATIONS STARTED DURING THIS VISIT:  ED Discharge Orders         Ordered    traMADol (ULTRAM) 50 MG tablet  Every 6 hours PRN        10/03/20 2227    ondansetron (ZOFRAN ODT) 4 MG disintegrating tablet  Every 8 hours PRN        10/03/20 2227              This chart was dictated using voice recognition software/Dragon. Despite best efforts to proofread, errors can occur which can change the meaning. Any change was purely unintentional.     Orvil Feil, PA-C 10/03/20 2327    Delton Prairie, MD 10/04/20 1500

## 2020-10-03 NOTE — Discharge Instructions (Addendum)
Take Tramadol as needed for pain.  Wear knee immobilizer with ambulation.

## 2020-10-03 NOTE — ED Triage Notes (Signed)
PT was at work and turned wrong and states she "twisted" her R knee and has been swelling since. PT did not fall.

## 2021-04-12 IMAGING — DX DG KNEE COMPLETE 4+V*R*
4 series · 4 of 4 positions shown · non-contrast
Comparison: None.

CLINICAL DATA: Twisting injury of the right knee

EXAM:
RIGHT KNEE - COMPLETE 4+ VIEW

[knee ap]
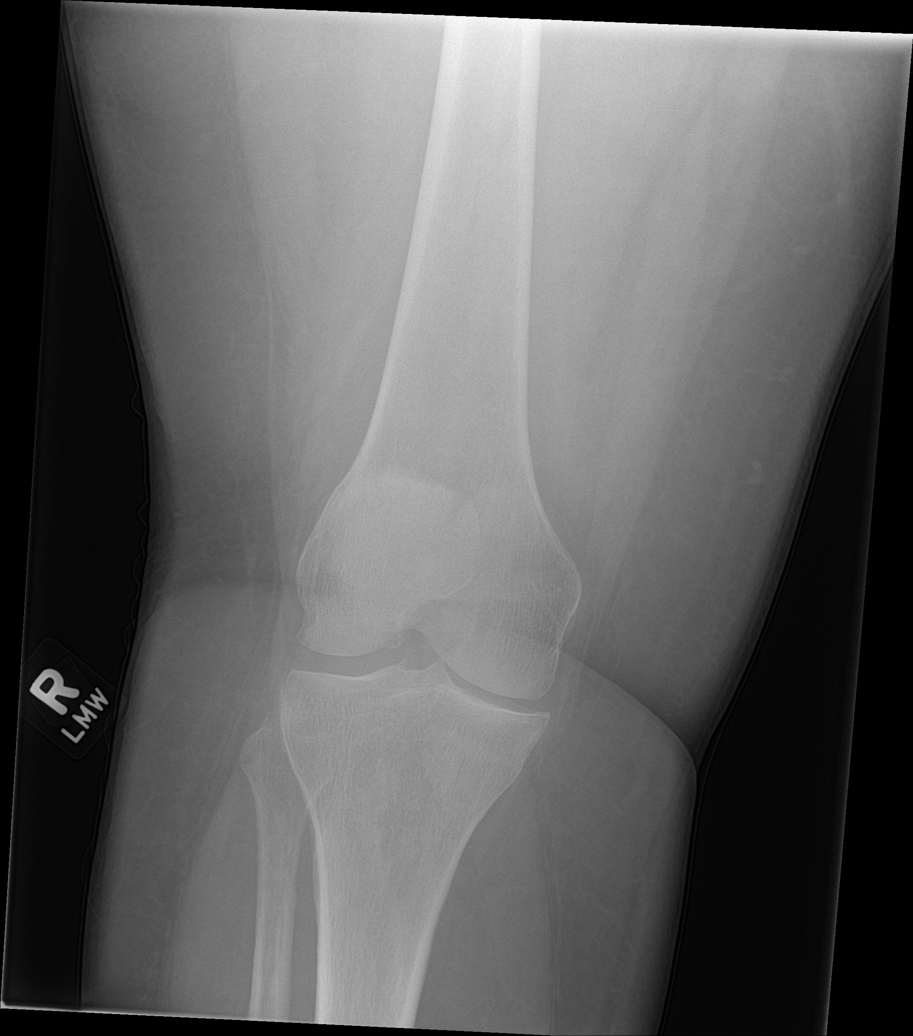

[knee lat]
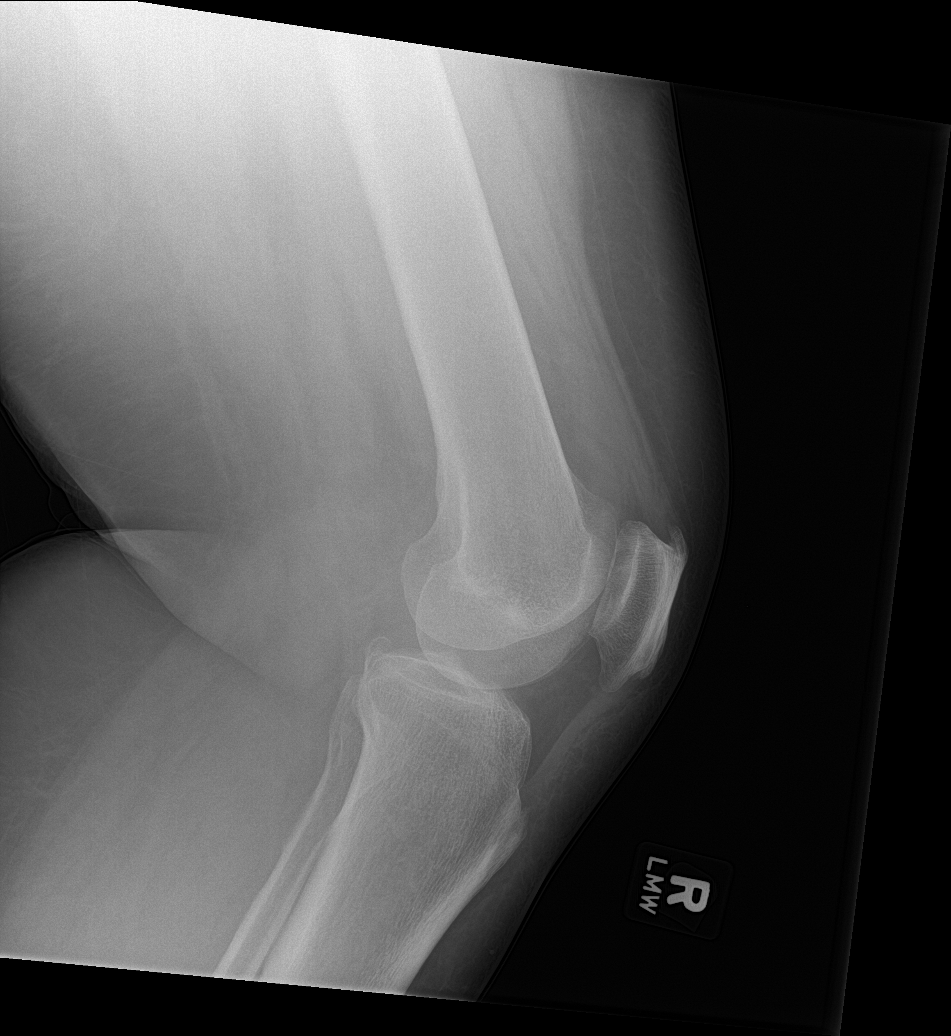

[knee obl (1 of 2)]
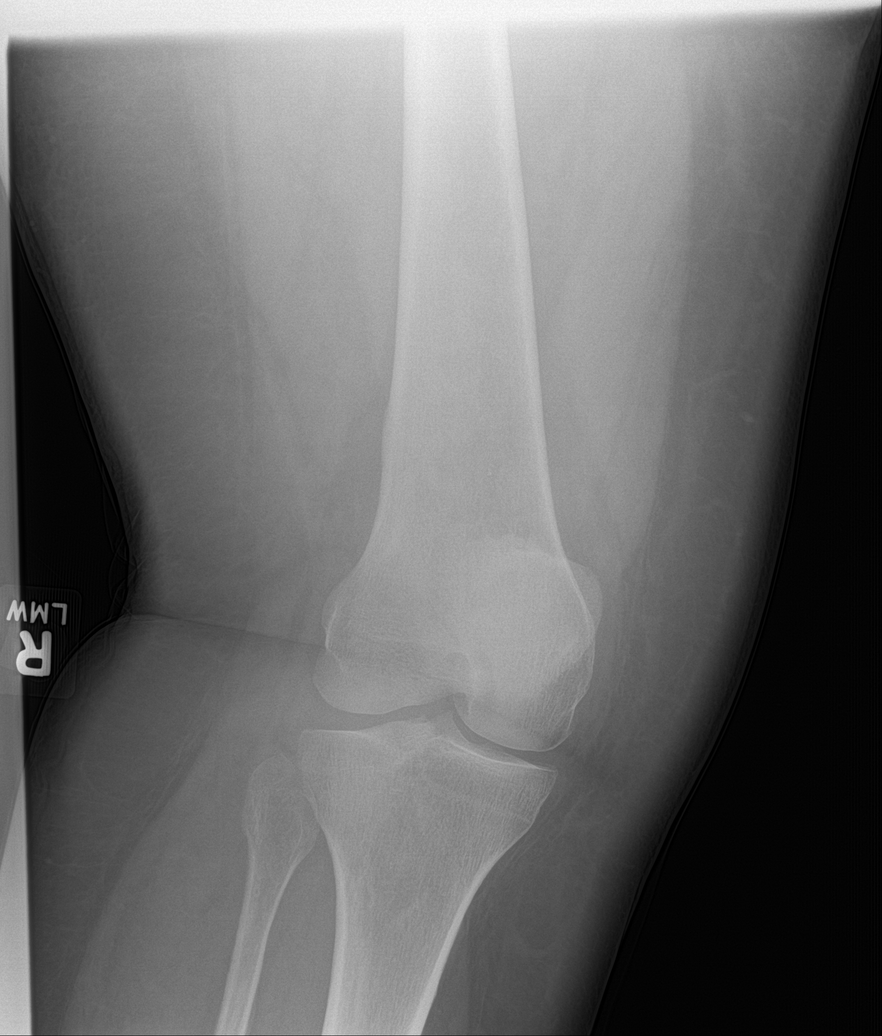

[knee obl (2 of 2)]
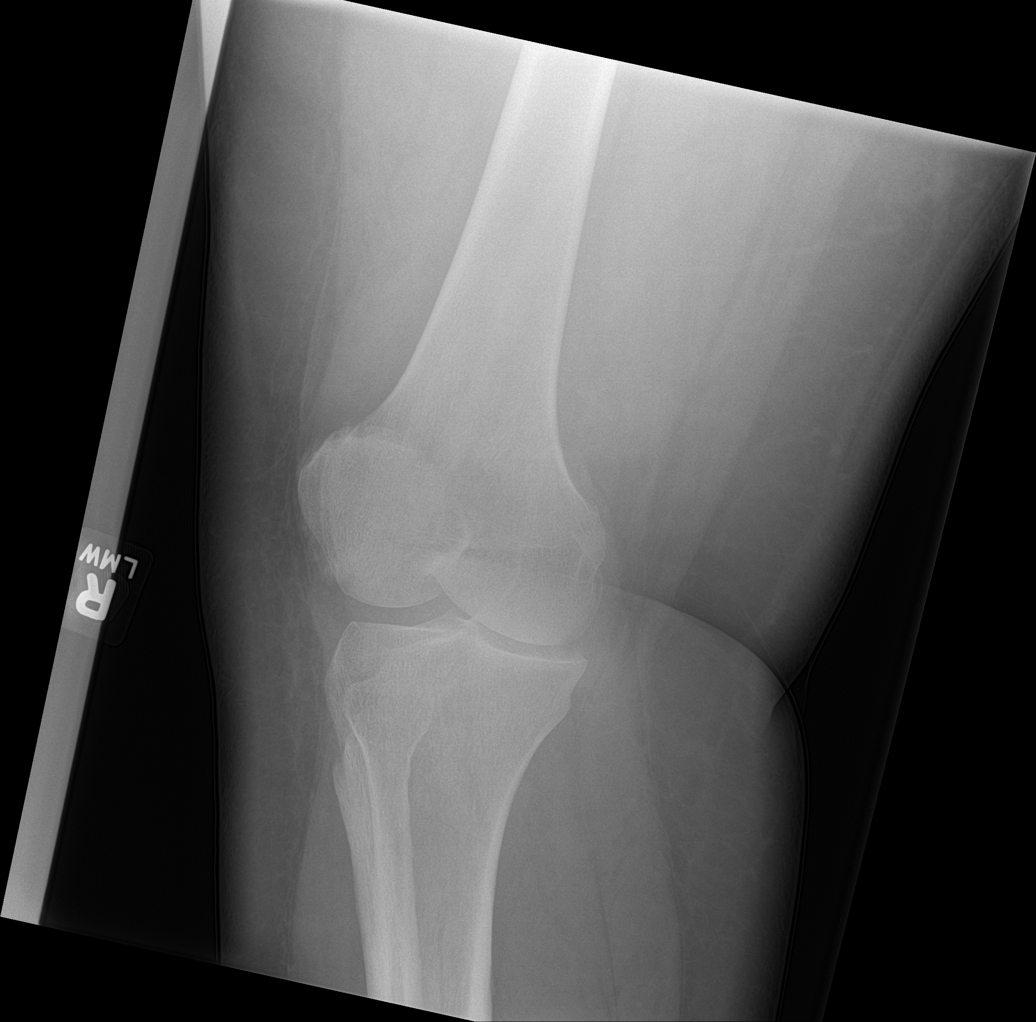

[4 of 4 positions shown; findings below may reference images not displayed]

FINDINGS: Moderate suprapatellar joint effusion. Questionable deep lateral
sulcus sign which could reflect an impaction or osteochondral type
injury along the superior-most articular surface of the lateral
femoral condyle. No other acute osseous abnormality is seen. Mild
tricompartmental degenerative changes. Minimal stranding is seen
anterior to the patellar tendon albeit without significant
thickening or discontinuity possibly related to patellar
enthesopathic changes with bidirectional anterior patellar spurs.
IMPRESSION: 1. Moderate suprapatellar joint effusion
2. Possible deep lateral sulcus sign which could reflect an
impaction or osteochondral type injury along the superior-most
articular surface of the lateral femoral condyle.
3. Mild tricompartmental degenerative changes.
4. Minimal stranding anterior to the patellar tendon albeit without
significant thickening or discontinuity. Possibly related to
enthesopathy along the extensor mechanism, correlate for point
tenderness.

## 2021-04-28 ENCOUNTER — Other Ambulatory Visit: Payer: Self-pay | Admitting: Orthopedic Surgery

## 2021-05-02 ENCOUNTER — Encounter
Admission: RE | Admit: 2021-05-02 | Discharge: 2021-05-02 | Disposition: A | Discharge status: Home / Self Care | Payer: Self-pay | Source: Ambulatory Visit | Attending: Orthopedic Surgery | Admitting: Orthopedic Surgery

## 2021-05-02 ENCOUNTER — Other Ambulatory Visit: Payer: Self-pay

## 2021-05-02 HISTORY — DX: Unspecified osteoarthritis, unspecified site: M19.90

## 2021-05-02 NOTE — Patient Instructions (Addendum)
Your procedure is scheduled on:05-08-21 THURSDAY Report to the Registration Desk on the 1st floor of the [facility]-Then proceed to the 2nd floor Surgery Desk in the Medical Mall To find out your arrival time, please call [phone] between 1PM - 3PM on:05-07-21 WEDNESDAY  REMEMBER: Instructions that are not followed completely may result in serious medical risk, up to and including death; or upon the discretion of your surgeon and anesthesiologist your surgery may need to be rescheduled.  Do not eat food after midnight the night before surgery.  No gum chewing, lozengers or hard candies.  You may however, drink CLEAR liquids up to 2 hours before you are scheduled to arrive for your surgery. Do not drink anything within 2 hours of your scheduled arrival time.  Clear liquids include: - water  - apple juice without pulp - gatorade  - black coffee or tea (Do NOT add milk or creamers to the coffee or tea) Do NOT drink anything that is not on this list.  Do not take any medication the day of surgery  One week prior to surgery: Stop Anti-inflammatories (NSAIDS) such as Advil, Aleve, Ibuprofen, Motrin, Naproxen, Naprosyn and Aspirin based products such as Excedrin, Goodys Powder, BC Powder.You may however, continue to take Tylenol if needed for pain up until the day of surgery.  Stop ANY OVER THE COUNTER supplements/vitamins until after surgery.  No Alcohol for 24 hours before or after surgery.  No Smoking including e-cigarettes for 24 hours prior to surgery.  No chewable tobacco products for at least 6 hours prior to surgery.  No nicotine patches on the day of surgery.  Do not use any "recreational" drugs for at least a week prior to your surgery.  Please be advised that the combination of cocaine and anesthesia may have negative outcomes, up to and including death. If you test positive for cocaine, your surgery will be cancelled.  On the morning of surgery brush your teeth with  toothpaste and water, you may rinse your mouth with mouthwash if you wish. Do not swallow any toothpaste or mouthwash.  Do not wear jewelry, make-up, hairpins, clips or nail polish.  Do not wear lotions, powders, or perfumes.   Do not shave body from the neck down 48 hours prior to surgery just in case you cut yourself which could leave a site for infection.  Also, freshly shaved skin may become irritated if using the CHG soap.  Contact lenses, hearing aids and dentures may not be worn into surgery.  Do not bring valuables to the hospital. [facility] is not responsible for any missing/lost belongings or valuables.   Use CHG Soap as directed on instruction sheet.  Notify your doctor if there is any change in your medical condition (cold, fever, infection).  Wear comfortable clothing (specific to your surgery type) to the hospital.  After surgery, you can help prevent lung complications by doing breathing exercises.  Take deep breaths and cough every 1-2 hours. Your doctor may order a device called an Incentive Spirometer to help you take deep breaths. When coughing or sneezing, hold a pillow firmly against your incision with both hands. This is called "splinting." Doing this helps protect your incision. It also decreases belly discomfort.  If you are being admitted to the hospital overnight, leave your suitcase in the car. After surgery it may be brought to your room.  If you are being discharged the day of surgery, you will not be allowed to drive home. You will  need a responsible adult (18 years or older) to drive you home and stay with you that night.   If you are taking public transportation, you will need to have a responsible adult (18 years or older) with you. Please confirm with your physician that it is acceptable to use public transportation.   Please call the Pre-admissions Testing Dept. at [phone] if you have any questions about these instructions.  Surgery  Visitation Policy:  Patients undergoing a surgery or procedure may have one family member or support person with them as long as that person is not COVID-19 positive or experiencing its symptoms.  That person may remain in the waiting area during the procedure.  Inpatient Visitation:    Visiting hours are 7 a.m. to 8 p.m. Inpatients will be allowed two visitors daily. The visitors may change each day during the patient's stay. No visitors under the age of 65. Any visitor under the age of 11 must be accompanied by an adult. The visitor must pass COVID-19 screenings, use hand sanitizer when entering and exiting the patient's room and wear a mask at all times, including in the patient's room. Patients must also wear a mask when staff or their visitor are in the room. Masking is required regardless of vaccination status.

## 2021-05-06 ENCOUNTER — Encounter
Admission: RE | Admit: 2021-05-06 | Discharge: 2021-05-06 | Disposition: A | Discharge status: Home / Self Care | Payer: Self-pay | Source: Ambulatory Visit | Attending: Orthopedic Surgery | Admitting: Orthopedic Surgery

## 2021-05-06 ENCOUNTER — Other Ambulatory Visit: Payer: Self-pay

## 2021-05-06 DIAGNOSIS — Z01812 Encounter for preprocedural laboratory examination: Secondary | ICD-10-CM | POA: Insufficient documentation

## 2021-05-06 LAB — CBC WITH DIFFERENTIAL/PLATELET
Abs Immature Granulocytes: 0.03 10*3/uL (ref 0.00–0.07)
Basophils Absolute: 0 10*3/uL (ref 0.0–0.1)
Basophils Relative: 0 %
Eosinophils Absolute: 0.2 10*3/uL (ref 0.0–0.5)
Eosinophils Relative: 2 %
HCT: 39.6 % (ref 36.0–46.0)
Hemoglobin: 13.5 g/dL (ref 12.0–15.0)
Immature Granulocytes: 0 %
Lymphocytes Relative: 30 %
Lymphs Abs: 2.9 10*3/uL (ref 0.7–4.0)
MCH: 30.3 pg (ref 26.0–34.0)
MCHC: 34.1 g/dL (ref 30.0–36.0)
MCV: 88.8 fL (ref 80.0–100.0)
Monocytes Absolute: 0.4 10*3/uL (ref 0.1–1.0)
Monocytes Relative: 4 %
Neutro Abs: 5.9 10*3/uL (ref 1.7–7.7)
Neutrophils Relative %: 64 %
Platelets: 257 10*3/uL (ref 150–400)
RBC: 4.46 MIL/uL (ref 3.87–5.11)
RDW: 14.3 % (ref 11.5–15.5)
WBC: 9.5 10*3/uL (ref 4.0–10.5)
nRBC: 0 % (ref 0.0–0.2)

## 2021-05-06 LAB — BASIC METABOLIC PANEL
Anion gap: 8 (ref 5–15)
BUN: 10 mg/dL (ref 6–20)
CO2: 28 mmol/L (ref 22–32)
Calcium: 9.2 mg/dL (ref 8.9–10.3)
Chloride: 102 mmol/L (ref 98–111)
Creatinine, Ser: 0.6 mg/dL (ref 0.44–1.00)
GFR, Estimated: >60 mL/min (ref >60)
Glucose, Bld: 171 mg/dL — ABNORMAL HIGH (ref 70–99)
Potassium: 3.9 mmol/L (ref 3.5–5.1)
Sodium: 138 mmol/L (ref 135–145)

## 2021-05-06 LAB — PROTIME-INR
INR: 1 (ref 0.8–1.2)
Prothrombin Time: 13.2 seconds (ref 11.4–15.2)

## 2021-05-06 LAB — APTT: aPTT: 40 seconds — ABNORMAL HIGH (ref 24–36)

## 2021-05-07 MED ORDER — LACTATED RINGERS IV SOLN: INTRAVENOUS | Status: DC

## 2021-05-07 MED ORDER — ORAL CARE MOUTH RINSE: 15.0000 mL | Freq: Once | OROMUCOSAL | Status: AC

## 2021-05-07 MED ORDER — ACETAMINOPHEN 500 MG PO TABS: 1000.0000 mg | ORAL_TABLET | ORAL | Status: AC

## 2021-05-07 MED ORDER — CHLORHEXIDINE GLUCONATE CLOTH 2 % EX PADS
6.0000 | MEDICATED_PAD | Freq: Once | CUTANEOUS | Status: AC
  Administered 2021-05-08: 6 via TOPICAL

## 2021-05-07 MED ORDER — CHLORHEXIDINE GLUCONATE CLOTH 2 % EX PADS: 6.0000 | MEDICATED_PAD | Freq: Once | CUTANEOUS | Status: DC

## 2021-05-07 MED ORDER — FAMOTIDINE 20 MG PO TABS: 20.0000 mg | ORAL_TABLET | Freq: Once | ORAL | Status: AC

## 2021-05-07 MED ORDER — CEFAZOLIN SODIUM-DEXTROSE 2-4 GM/100ML-% IV SOLN
2.0000 g | INTRAVENOUS | Status: AC
  Administered 2021-05-08: 1 g via INTRAVENOUS
  Administered 2021-05-08: 2 g via INTRAVENOUS

## 2021-05-07 MED ORDER — CHLORHEXIDINE GLUCONATE 0.12 % MT SOLN
15.0000 mL | Freq: Once | OROMUCOSAL | Status: AC
Start: 2021-05-07 — End: 2021-05-08

## 2021-05-08 ENCOUNTER — Ambulatory Visit: Payer: Worker's Compensation | Admitting: Anesthesiology

## 2021-05-08 ENCOUNTER — Encounter
Admission: RE | Disposition: A | Discharge status: Home / Self Care | Payer: Self-pay | Source: Home / Self Care | Attending: Orthopedic Surgery

## 2021-05-08 ENCOUNTER — Other Ambulatory Visit: Payer: Self-pay

## 2021-05-08 ENCOUNTER — Ambulatory Visit
Admission: RE | Admit: 2021-05-08 | Discharge: 2021-05-08 | Disposition: A | Discharge status: Home / Self Care | Payer: Worker's Compensation | Attending: Orthopedic Surgery | Admitting: Orthopedic Surgery

## 2021-05-08 ENCOUNTER — Encounter: Payer: Self-pay | Admitting: Orthopedic Surgery

## 2021-05-08 DIAGNOSIS — M2241 Chondromalacia patellae, right knee: Secondary | ICD-10-CM | POA: Insufficient documentation

## 2021-05-08 DIAGNOSIS — F17210 Nicotine dependence, cigarettes, uncomplicated: Secondary | ICD-10-CM | POA: Insufficient documentation

## 2021-05-08 DIAGNOSIS — M94261 Chondromalacia, right knee: Secondary | ICD-10-CM | POA: Insufficient documentation

## 2021-05-08 DIAGNOSIS — Z7982 Long term (current) use of aspirin: Secondary | ICD-10-CM | POA: Diagnosis not present

## 2021-05-08 DIAGNOSIS — X58XXXA Exposure to other specified factors, initial encounter: Secondary | ICD-10-CM | POA: Insufficient documentation

## 2021-05-08 DIAGNOSIS — S83231A Complex tear of medial meniscus, current injury, right knee, initial encounter: Secondary | ICD-10-CM | POA: Diagnosis not present

## 2021-05-08 HISTORY — PX: KNEE ARTHROSCOPY WITH MEDIAL MENISECTOMY: SHX5651

## 2021-05-08 LAB — URINE DRUG SCREEN, QUALITATIVE (ARMC ONLY)
Amphetamines, Ur Screen: NOT DETECTED
Barbiturates, Ur Screen: NOT DETECTED
Benzodiazepine, Ur Scrn: NOT DETECTED
Cannabinoid 50 Ng, Ur ~~LOC~~: NOT DETECTED
Cocaine Metabolite,Ur ~~LOC~~: NOT DETECTED
MDMA (Ecstasy)Ur Screen: NOT DETECTED
Methadone Scn, Ur: NOT DETECTED
Opiate, Ur Screen: NOT DETECTED
Phencyclidine (PCP) Ur S: NOT DETECTED
Tricyclic, Ur Screen: NOT DETECTED

## 2021-05-08 LAB — POCT PREGNANCY, URINE: Preg Test, Ur: NEGATIVE

## 2021-05-08 SURGERY — ARTHROSCOPY, KNEE, WITH MEDIAL MENISCECTOMY
Anesthesia: General | Site: Knee | Laterality: Right

## 2021-05-08 MED ORDER — LIDOCAINE HCL (PF) 1 % IJ SOLN
INTRAMUSCULAR | Status: AC
  Filled 2021-05-08: qty 30

## 2021-05-08 MED ORDER — LIDOCAINE HCL (CARDIAC) PF 100 MG/5ML IV SOSY
PREFILLED_SYRINGE | INTRAVENOUS | Status: DC | PRN
  Administered 2021-05-08: 100 mg via INTRAVENOUS

## 2021-05-08 MED ORDER — SUCCINYLCHOLINE CHLORIDE 200 MG/10ML IV SOSY
PREFILLED_SYRINGE | INTRAVENOUS | Status: AC
  Filled 2021-05-08: qty 10

## 2021-05-08 MED ORDER — FAMOTIDINE 20 MG PO TABS
ORAL_TABLET | ORAL | Status: AC
  Administered 2021-05-08: 20 mg via ORAL
  Filled 2021-05-08: qty 1

## 2021-05-08 MED ORDER — BUPIVACAINE-EPINEPHRINE (PF) 0.25% -1:200000 IJ SOLN
INTRAMUSCULAR | Status: DC | PRN
  Administered 2021-05-08: 7 mL
  Administered 2021-05-08: 23 mL

## 2021-05-08 MED ORDER — GLYCOPYRROLATE 0.2 MG/ML IJ SOLN
INTRAMUSCULAR | Status: DC | PRN
  Administered 2021-05-08: .2 mg via INTRAVENOUS

## 2021-05-08 MED ORDER — DEXMEDETOMIDINE (PRECEDEX) IN NS 20 MCG/5ML (4 MCG/ML) IV SYRINGE
PREFILLED_SYRINGE | INTRAVENOUS | Status: AC
  Filled 2021-05-08: qty 5

## 2021-05-08 MED ORDER — MIDAZOLAM HCL 2 MG/2ML IJ SOLN
INTRAMUSCULAR | Status: AC
  Administered 2021-05-08: 1 mg via INTRAVENOUS
  Filled 2021-05-08: qty 2

## 2021-05-08 MED ORDER — MIDAZOLAM HCL 2 MG/2ML IJ SOLN
INTRAMUSCULAR | Status: AC
  Filled 2021-05-08: qty 2

## 2021-05-08 MED ORDER — ROCURONIUM BROMIDE 10 MG/ML (PF) SYRINGE
PREFILLED_SYRINGE | INTRAVENOUS | Status: AC
  Filled 2021-05-08: qty 30

## 2021-05-08 MED ORDER — FENTANYL CITRATE (PF) 100 MCG/2ML IJ SOLN
INTRAMUSCULAR | Status: DC | PRN
  Administered 2021-05-08 (×2): 50 ug via INTRAVENOUS

## 2021-05-08 MED ORDER — LIDOCAINE HCL (PF) 2 % IJ SOLN
INTRAMUSCULAR | Status: AC
  Filled 2021-05-08: qty 15

## 2021-05-08 MED ORDER — ONDANSETRON HCL 4 MG PO TABS: 4.0000 mg | ORAL_TABLET | Freq: Three times a day (TID) | ORAL | 0 refills | Status: DC | PRN

## 2021-05-08 MED ORDER — DEXAMETHASONE SODIUM PHOSPHATE 10 MG/ML IJ SOLN
INTRAMUSCULAR | Status: DC | PRN
  Administered 2021-05-08: 10 mg via INTRAVENOUS

## 2021-05-08 MED ORDER — LIDOCAINE HCL (PF) 1 % IJ SOLN
INTRAMUSCULAR | Status: DC | PRN
  Administered 2021-05-08: 10 mL
  Administered 2021-05-08: 20 mL

## 2021-05-08 MED ORDER — KETOROLAC TROMETHAMINE 30 MG/ML IJ SOLN
INTRAMUSCULAR | Status: DC | PRN
  Administered 2021-05-08: 30 mg via INTRAVENOUS

## 2021-05-08 MED ORDER — ACETAMINOPHEN 500 MG PO TABS
ORAL_TABLET | ORAL | Status: AC
  Administered 2021-05-08: 1000 mg via ORAL
  Filled 2021-05-08: qty 2

## 2021-05-08 MED ORDER — HYDROCODONE-ACETAMINOPHEN 5-325 MG PO TABS
1.0000 | ORAL_TABLET | Freq: Once | ORAL | Status: AC
  Administered 2021-05-08: 1 via ORAL

## 2021-05-08 MED ORDER — ROCURONIUM BROMIDE 100 MG/10ML IV SOLN
INTRAVENOUS | Status: DC | PRN
  Administered 2021-05-08 (×2): 20 mg via INTRAVENOUS
  Administered 2021-05-08: 10 mg via INTRAVENOUS
  Administered 2021-05-08: 20 mg via INTRAVENOUS

## 2021-05-08 MED ORDER — CHLORHEXIDINE GLUCONATE 0.12 % MT SOLN
OROMUCOSAL | Status: AC
  Administered 2021-05-08: 15 mL via OROMUCOSAL
  Filled 2021-05-08: qty 15

## 2021-05-08 MED ORDER — FENTANYL CITRATE (PF) 100 MCG/2ML IJ SOLN
INTRAMUSCULAR | Status: AC
  Filled 2021-05-08: qty 2

## 2021-05-08 MED ORDER — ONDANSETRON HCL 4 MG/2ML IJ SOLN
INTRAMUSCULAR | Status: DC | PRN
  Administered 2021-05-08: 4 mg via INTRAVENOUS

## 2021-05-08 MED ORDER — HYDROCODONE-ACETAMINOPHEN 5-325 MG PO TABS: 1.0000 | ORAL_TABLET | ORAL | 0 refills | Status: DC | PRN

## 2021-05-08 MED ORDER — CEFAZOLIN SODIUM-DEXTROSE 2-4 GM/100ML-% IV SOLN
INTRAVENOUS | Status: AC
  Filled 2021-05-08: qty 100

## 2021-05-08 MED ORDER — FENTANYL CITRATE (PF) 100 MCG/2ML IJ SOLN
25.0000 ug | INTRAMUSCULAR | Status: DC | PRN
Start: 2021-05-08 — End: 2021-05-08

## 2021-05-08 MED ORDER — HYDROCODONE-ACETAMINOPHEN 5-325 MG PO TABS
ORAL_TABLET | ORAL | Status: AC
  Filled 2021-05-08: qty 1

## 2021-05-08 MED ORDER — ASPIRIN EC 325 MG PO TBEC: 325.0000 mg | DELAYED_RELEASE_TABLET | Freq: Every day | ORAL | 0 refills | Status: DC

## 2021-05-08 MED ORDER — SUGAMMADEX SODIUM 500 MG/5ML IV SOLN
INTRAVENOUS | Status: AC
  Filled 2021-05-08: qty 5

## 2021-05-08 MED ORDER — ONDANSETRON HCL 4 MG/2ML IJ SOLN
INTRAMUSCULAR | Status: AC
  Filled 2021-05-08: qty 6

## 2021-05-08 MED ORDER — MIDAZOLAM HCL 2 MG/2ML IJ SOLN: 1.0000 mg | Freq: Once | INTRAMUSCULAR | Status: AC

## 2021-05-08 MED ORDER — GLYCOPYRROLATE 0.2 MG/ML IJ SOLN
INTRAMUSCULAR | Status: AC
  Filled 2021-05-08: qty 1

## 2021-05-08 MED ORDER — PROPOFOL 10 MG/ML IV BOLUS
INTRAVENOUS | Status: DC | PRN
  Administered 2021-05-08: 200 mg via INTRAVENOUS

## 2021-05-08 MED ORDER — CEFAZOLIN SODIUM 1 G IJ SOLR
INTRAMUSCULAR | Status: AC
  Filled 2021-05-08: qty 10

## 2021-05-08 MED ORDER — BUPIVACAINE-EPINEPHRINE (PF) 0.25% -1:200000 IJ SOLN
INTRAMUSCULAR | Status: AC
  Filled 2021-05-08: qty 30

## 2021-05-08 MED ORDER — SUGAMMADEX SODIUM 500 MG/5ML IV SOLN
INTRAVENOUS | Status: DC | PRN
  Administered 2021-05-08: 500 mg via INTRAVENOUS

## 2021-05-08 MED ORDER — DEXAMETHASONE SODIUM PHOSPHATE 10 MG/ML IJ SOLN
INTRAMUSCULAR | Status: AC
  Filled 2021-05-08: qty 3

## 2021-05-08 MED ORDER — ONDANSETRON HCL 4 MG/2ML IJ SOLN: 4.0000 mg | Freq: Once | INTRAMUSCULAR | Status: DC | PRN

## 2021-05-08 MED ORDER — PROPOFOL 10 MG/ML IV BOLUS
INTRAVENOUS | Status: AC
  Filled 2021-05-08: qty 20

## 2021-05-08 MED ORDER — LACTATED RINGERS IV SOLN: INTRAVENOUS | Status: DC | PRN

## 2021-05-08 MED ORDER — MIDAZOLAM HCL 2 MG/2ML IJ SOLN
INTRAMUSCULAR | Status: DC | PRN
  Administered 2021-05-08: 2 mg via INTRAVENOUS

## 2021-05-08 MED ORDER — KETOROLAC TROMETHAMINE 30 MG/ML IJ SOLN
INTRAMUSCULAR | Status: AC
  Filled 2021-05-08: qty 1

## 2021-05-08 MED ORDER — SUCCINYLCHOLINE CHLORIDE 20 MG/ML IJ SOLN
INTRAMUSCULAR | Status: DC | PRN
  Administered 2021-05-08: 200 mg via INTRAVENOUS

## 2021-05-08 MED ORDER — LACTATED RINGERS IR SOLN
Status: DC | PRN
  Administered 2021-05-08 (×2): 3000 mL
  Administered 2021-05-08: 12000 mL

## 2021-05-08 MED ORDER — DEXMEDETOMIDINE (PRECEDEX) IN NS 20 MCG/5ML (4 MCG/ML) IV SYRINGE
PREFILLED_SYRINGE | INTRAVENOUS | Status: DC | PRN
  Administered 2021-05-08: 4 ug via INTRAVENOUS
  Administered 2021-05-08 (×2): 8 ug via INTRAVENOUS
  Administered 2021-05-08: 4 ug via INTRAVENOUS

## 2021-05-08 SURGICAL SUPPLY — 38 items
ADAPTER IRRIG TUBE 2 SPIKE SOL (ADAPTER) ×4 IMPLANT
ADPR TBG 2 SPK PMP STRL ASCP (ADAPTER) ×2
BUR RADIUS 3.5 (BURR) ×2 IMPLANT
BUR RADIUS 4.0X18.5 (BURR) ×2 IMPLANT
COOLER POLAR GLACIER W/PUMP (MISCELLANEOUS) ×2 IMPLANT
COVER WAND RF STERILE (DRAPES) ×2 IMPLANT
CUFF TOURN SGL QUICK 42 (TOURNIQUET CUFF) ×2 IMPLANT
DRAPE IMP U-DRAPE 54X76 (DRAPES) ×2 IMPLANT
DURAPREP 26ML APPLICATOR (WOUND CARE) ×6 IMPLANT
GAUZE SPONGE 4X4 12PLY STRL (GAUZE/BANDAGES/DRESSINGS) ×2 IMPLANT
GAUZE XEROFORM 1X8 LF (GAUZE/BANDAGES/DRESSINGS) ×2 IMPLANT
GLOVE SURG ORTHO LTX SZ9 (GLOVE) ×4 IMPLANT
GLOVE SURG UNDER POLY LF SZ9 (GLOVE) ×2 IMPLANT
GOWN STRL REUS W/ TWL LRG LVL3 (GOWN DISPOSABLE) ×1 IMPLANT
GOWN STRL REUS W/TWL 2XL LVL3 (GOWN DISPOSABLE) ×2 IMPLANT
GOWN STRL REUS W/TWL LRG LVL3 (GOWN DISPOSABLE) ×2
IV LACTATED RINGER IRRG 3000ML (IV SOLUTION) ×12
IV LR IRRIG 3000ML ARTHROMATIC (IV SOLUTION) ×6 IMPLANT
KIT TURNOVER KIT A (KITS) ×2 IMPLANT
MANIFOLD NEPTUNE II (INSTRUMENTS) ×4 IMPLANT
MAT ABSORB  FLUID 56X50 GRAY (MISCELLANEOUS) ×1
MAT ABSORB FLUID 56X50 GRAY (MISCELLANEOUS) ×1 IMPLANT
NEEDLE HYPO 22GX1.5 SAFETY (NEEDLE) ×2 IMPLANT
PACK ARTHROSCOPY KNEE (MISCELLANEOUS) ×2 IMPLANT
PAD ABD DERMACEA PRESS 5X9 (GAUZE/BANDAGES/DRESSINGS) ×4 IMPLANT
PAD WRAPON POLOR MULTI XL (MISCELLANEOUS) ×1 IMPLANT
SET TUBE SUCT SHAVER OUTFL 24K (TUBING) ×2 IMPLANT
SET TUBE TIP INTRA-ARTICULAR (MISCELLANEOUS) ×2 IMPLANT
SOL PREP PVP 2OZ (MISCELLANEOUS) ×2
SOLUTION PREP PVP 2OZ (MISCELLANEOUS) ×1 IMPLANT
STRIP CLOSURE SKIN 1/2X4 (GAUZE/BANDAGES/DRESSINGS) ×2 IMPLANT
SUT ETHILON 4-0 (SUTURE) ×2
SUT ETHILON 4-0 FS2 18XMFL BLK (SUTURE) ×1
SUTURE ETHLN 4-0 FS2 18XMF BLK (SUTURE) ×1 IMPLANT
TUBING ARTHRO INFLOW-ONLY STRL (TUBING) ×2 IMPLANT
WAND WEREWOLF FLOW 90D (MISCELLANEOUS) ×2 IMPLANT
WRAP-ON POLOR PAD MULTI XL (MISCELLANEOUS) ×1
WRAPON POLOR PAD MULTI XL (MISCELLANEOUS) ×2

## 2021-05-08 NOTE — Anesthesia Postprocedure Evaluation (Signed)
Anesthesia Post Note  Patient: Denise Velez  Procedure(s) Performed: RIGHT KNEE ARTHROSCOPY WITH MEDIAL MENISECTOMY (Right: Knee)  Patient location during evaluation: PACU Anesthesia Type: General Level of consciousness: awake and alert Pain management: pain level controlled Vital Signs Assessment: post-procedure vital signs reviewed and stable Respiratory status: spontaneous breathing, nonlabored ventilation, respiratory function stable and patient connected to nasal cannula oxygen Cardiovascular status: blood pressure returned to baseline and stable Postop Assessment: no apparent nausea or vomiting Anesthetic complications: no   No notable events documented.   Last Vitals:  Vitals:   05/08/21 1706 05/08/21 1710  BP: (!) 182/90 (!) 176/77  Pulse: 73   Resp: 14   Temp: (!) 36.1 C   SpO2: 100%     Last Pain:  Vitals:   05/08/21 1706  TempSrc: Temporal  PainSc: 0-No pain                 Corinda Gubler

## 2021-05-08 NOTE — Anesthesia Procedure Notes (Signed)
Procedure Name: Intubation Date/Time: 05/08/2021 2:52 PM Performed by: Danelle Berry, CRNA Pre-anesthesia Checklist: Patient identified, Emergency Drugs available, Suction available and Patient being monitored Patient Re-evaluated:Patient Re-evaluated prior to induction Oxygen Delivery Method: Circle system utilized Preoxygenation: Pre-oxygenation with 100% oxygen Induction Type: IV induction Ventilation: Mask ventilation without difficulty Laryngoscope Size: McGraph and 3 Grade View: Grade I Tube type: Oral Tube size: 7.0 mm Number of attempts: 1 Airway Equipment and Method: Stylet and Oral airway Placement Confirmation: ETT inserted through vocal cords under direct vision, positive ETCO2 and breath sounds checked- equal and bilateral Secured at: 22 cm Tube secured with: Tape Dental Injury: Teeth and Oropharynx as per pre-operative assessment

## 2021-05-08 NOTE — Progress Notes (Signed)
Pt medicated with versed preoperatively per Dr Henrene Hawking, pt crying and very anxious. Pt placed on cardiac monitor, NSR, VSS after receiving versed IV pt stated that she feels more relaxed.

## 2021-05-08 NOTE — Transfer of Care (Signed)
Immediate Anesthesia Transfer of Care Note  Patient: Denise Velez  Procedure(s) Performed: RIGHT KNEE ARTHROSCOPY WITH MEDIAL MENISECTOMY (Right: Knee)  Patient Location: PACU  Anesthesia Type:General  Level of Consciousness: drowsy  Airway & Oxygen Therapy: Patient Spontanous Breathing and Patient connected to face mask oxygen  Post-op Assessment: Report given to RN and Post -op Vital signs reviewed and stable  Post vital signs: Reviewed and stable  Last Vitals:  Vitals Value Taken Time  BP 143/59 05/08/21 1616  Temp 36.3 C 05/08/21 1616  Pulse 73 05/08/21 1618  Resp 20 05/08/21 1618  SpO2 100 % 05/08/21 1618  Vitals shown include unvalidated device data.  Last Pain:  Vitals:   05/08/21 1220  TempSrc: Temporal  PainSc: 3          Complications: No notable events documented.

## 2021-05-08 NOTE — Op Note (Signed)
PATIENT:  Denise Velez  PRE-OPERATIVE DIAGNOSIS:  TEAR OF MEDIAL MENISCUS AND OSTEOARTHRITIS, RIGHT KNEE  POST-OPERATIVE DIAGNOSIS:  Same  PROCEDURE: Right knee arthroscopic partial medial meniscectomy, chondroplasty of the medial femoral condyle, trochlea and undersurface of the patella and limited synovectomy  SURGEON:  Thornton Park, MD  ANESTHESIA:   General  PREOPERATIVE INDICATIONS:  Denise Velez  55 y.o. female with a diagnosis of TEAR OF MEDIAL MENISCUS of the right knee confirmed by MRI who failed conservative management and elected for surgical management.    The risks benefits and alternatives were discussed with the patient preoperatively including the risks of infection, bleeding, nerve injury, knee stiffness, persistent pain, osteoarthritis and the need for further surgery. Medical  risks include DVT and pulmonary embolism, myocardial infarction, stroke, pneumonia, respiratory failure and death. The patient understood these risks and wished to proceed.  OPERATIVE FINDINGS: Complex tear of the posterior horn of the medial meniscus.  Full-thickness grade 4 chondral lesion measuring 5 x 15 mm during portion of the medial femoral condyle.  Diffuse synovitis throughout the right knee.  ACL intact.  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  Patient had grade 2 diffuse chondromalacia of the tibial plateau.  OPERATIVE PROCEDURE: Patient was met in the preoperative area. The operative extremity was signed with the word yes and my initials according the hospital's correct site of surgery protocol.  The patient was brought to the operating room where they was placed supine on the operative table. General anesthesia was administered. The patient was prepped and draped in a sterile fashion.  A timeout was performed to verify the patient's name, date of birth, medical record number, correct site of surgery correct procedure to be  performed. It was also used to verify the patient received antibiotics that all appropriate instruments, and radiographic studies were available in the room. Once all in attendance were in agreement, the case began.  Proposed arthroscopy incisions were drawn out with a surgical marker. These were pre-injected with 1% lidocaine plain. An 11 blade was used to establish an inferior lateral and inferomedial portals. The inferomedial portal was created using a 18-gauge spinal needle under direct visualization.  A full diagnostic examination of the knee was performed including the suprapatellar pouch, patellofemoral joint, medial lateral compartments as well as the medial lateral gutters, the intercondylar notch in the posterior knee.  A partial synovectomy was performed using a 4.0 resector shaver blade and 90 [employer] wand in the medial and lateral gutters, the suprapatellar pouch and the anterior knee which allowed for better visualization..  Patient had a complex, unstable meniscal tear to probing involving the posterior horn of the medial meniscus.  The meniscal tear was treated with a 3.5 and 4.0 resector shaver blade and straight duckbill basket. The medial meniscal tear was debrided until a stable rim was achieved.   A chondroplasty of the medial femoral condyle, trochlea and undersurface of patella was also performed using a 4.0 resector shaver blade until a stable chondral rim was achieved.  Given the full-thickness nature of the medial femoral condyle lesion, I contact the patient's daughter during the case to discuss the possibility of performing a microfracture procedure during this case.  Patient's daughter indicated that she did not think her mother would want to be on crutches for 6 weeks postop and therefore a microfracture was not performed.    The knee was then copiously lavaged. All arthroscopic instruments were removed. The 2 arthroscopy portals  were closed with 4-0 nylon. Steri-Strips  were applied along with a dry sterile and compressive dressing. The patient was brought to the PACU in stable condition. I was scrubbed and present for the entire case and all sharp and instrument counts were correct at the conclusion the case. I spoke with the patient's family by phone to let him know the case was performed without complication and the patient was stable in the recovery room.    Denise Gaul, MD

## 2021-05-08 NOTE — H&P (Signed)
PREOPERATIVE H&P  Chief Complaint: Right knee Medial Meniscus Tear and Osteoarthritis  HPI: Denise Velez is a 55 y.o. female who presents for preoperative history and physical with a diagnosis of Right knee Medial Meniscus Tear and Osteoarthritis confirmed by MRI. Symptoms of pain, swelling and limited ROM are significantly impairing activities of daily living.  Patient's failed nonoperative management and wished to proceed with a right knee arthroscopic partial medial meniscectomy.  Past Medical History:  Diagnosis Date   Arthritis    Patient denies medical problems    Past Surgical History:  Procedure Laterality Date   CESAREAN SECTION     times 2   Social History   Socioeconomic History   Marital status: Divorced    Spouse name: Not on file   Number of children: Not on file   Years of education: Not on file   Highest education level: Not on file  Occupational History   Not on file  Tobacco Use   Smoking status: Every Day    Packs/day: 0.50    Years: 8.00    Pack years: 4.00    Types: Cigarettes   Smokeless tobacco: Never  Vaping Use   Vaping Use: Never used  Substance and Sexual Activity   Alcohol use: Yes    Comment: occasionaly   Drug use: Yes    Types: Marijuana    Comment: occ   Sexual activity: Not on file  Other Topics Concern   Not on file  Social History Narrative   Not on file   Social Determinants of Health   Financial Resource Strain: Not on file  Food Insecurity: Not on file  Transportation Needs: Not on file  Physical Activity: Not on file  Stress: Not on file  Social Connections: Not on file   History reviewed. No pertinent family history. Allergies  Allergen Reactions   Sulfa Antibiotics Rash   Prior to Admission medications   Medication Sig Start Date End Date Taking? Authorizing Provider  acetaminophen (TYLENOL) 500 MG tablet Take 1,000 mg by mouth every 8 (eight) hours as needed for moderate pain.   Yes [provider]   aspirin EC 325 MG tablet Take 1 tablet (325 mg total) by mouth daily. 05/08/21  Yes Juanell Fairly, MD  diphenhydramine-acetaminophen (TYLENOL PM) 25-500 MG TABS tablet Take 2 tablets by mouth at bedtime as needed (sleep/pain).   Yes [provider]  HYDROcodone-acetaminophen (NORCO) 5-325 MG tablet Take 1 tablet by mouth every 4 (four) hours as needed for moderate pain. 05/08/21  Yes Juanell Fairly, MD  ondansetron (ZOFRAN) 4 MG tablet Take 1 tablet (4 mg total) by mouth every 8 (eight) hours as needed for nausea or vomiting. 05/08/21  Yes Juanell Fairly, MD     Positive ROS: All other systems have been reviewed and were otherwise negative with the exception of those mentioned in the HPI and as above.  Physical Exam: General: Alert, no acute distress Cardiovascular: Regular rate and rhythm, no murmurs rubs or gallops.  No pedal edema Respiratory: Clear to auscultation bilaterally, no wheezes rales or rhonchi. No cyanosis, no use of accessory musculature GI: No organomegaly, abdomen is soft and non-tender nondistended with positive bowel sounds. Skin: Skin intact, no lesions within the operative field. Neurologic: Sensation intact distally Psychiatric: Patient is competent for consent with normal mood and affect Lymphatic: No cervical lymphadenopathy  MUSCULOSKELETAL: Right knee: Patient skin is intact.  There is no erythema ecchymosis or large effusion. The patient's range of motion includes full  extension to approximately 110-115 degrees of flexion. She has tenderness over the medial joint line and positive McMurray's test. There is no erythema, ecchymosis, or significant knee effusion. Distally, she is neurovascularly intact.   Assessment: Right knee Medial Meniscus Tear and Osteoarthritis  Plan: Plan for Procedure(s): RIGHT KNEE ARTHROSCOPY WITH MEDIAL MENISECTOMY  I reviewed the details of the operation as well as the postoperative course with the patient.  I answered  all her questions.  A preop history and physical was performed at the bedside today.  The right knee was marked according hospital's correct site of surgery protocol after verbally confirming with the patient that this was the correct site of surgery.  I discussed the risks and benefits of surgery. The risks include but are not limited to infection, bleeding, nerve or blood vessel injury, joint stiffness or loss of motion, persistent pain, weakness or instability, osteoarthritis, retear of the meniscus and the need for further surgery. Medical risks include but are not limited to DVT and pulmonary embolism, myocardial infarction, stroke, pneumonia, respiratory failure and death. Patient understood these risks and wished to proceed.     Juanell Fairly, MD   05/08/2021 5:00 PM

## 2021-05-08 NOTE — Anesthesia Preprocedure Evaluation (Addendum)
Anesthesia Evaluation  Patient identified by MRN, date of birth, ID band Patient awake    Reviewed: Allergy & Precautions, NPO status , Patient's Chart, lab work & pertinent test results  History of Anesthesia Complications Negative for: history of anesthetic complications  Airway Mallampati: III       Dental   Pulmonary neg sleep apnea, neg COPD, Current Smoker,           Cardiovascular (-) hypertension(-) Past MI and (-) CHF (-) pacemaker(-) Valvular Problems/Murmurs     Neuro/Psych neg Seizures    GI/Hepatic Neg liver ROS, neg GERD  ,  Endo/Other  neg diabetesMorbid obesity  Renal/GU negative Renal ROS     Musculoskeletal   Abdominal   Peds  Hematology   Anesthesia Other Findings   Reproductive/Obstetrics                             Anesthesia Physical Anesthesia Plan  ASA: 3  Anesthesia Plan: General   Post-op Pain Management:    Induction: Intravenous  PONV Risk Score and Plan: 2 and Ondansetron and Dexamethasone  Airway Management Planned: LMA  Additional Equipment:   Intra-op Plan:   Post-operative Plan:   Informed Consent: I have reviewed the patients History and Physical, chart, labs and discussed the procedure including the risks, benefits and alternatives for the proposed anesthesia with the patient or authorized representative who has indicated his/her understanding and acceptance.       Plan Discussed with:   Anesthesia Plan Comments:        Anesthesia Quick Evaluation

## 2021-05-08 NOTE — Progress Notes (Signed)
Pt refused crutches.  Stated she will get walker from [employer]

## 2021-05-09 ENCOUNTER — Encounter: Payer: Self-pay | Admitting: Orthopedic Surgery

## 2022-01-07 ENCOUNTER — Ambulatory Visit: Payer: Self-pay | Admitting: Internal Medicine

## 2022-01-15 ENCOUNTER — Other Ambulatory Visit: Payer: Self-pay

## 2022-01-15 ENCOUNTER — Ambulatory Visit (INDEPENDENT_AMBULATORY_CARE_PROVIDER_SITE_OTHER): Payer: 59 | Admitting: Nurse Practitioner

## 2022-01-15 ENCOUNTER — Encounter: Payer: Self-pay | Admitting: Nurse Practitioner

## 2022-01-15 VITALS — BP 185/92 | HR 84 | Ht 68.0 in | Wt 283.8 lb

## 2022-01-15 DIAGNOSIS — Z7689 Persons encountering health services in other specified circumstances: Secondary | ICD-10-CM | POA: Diagnosis not present

## 2022-01-15 DIAGNOSIS — Z124 Encounter for screening for malignant neoplasm of cervix: Secondary | ICD-10-CM

## 2022-01-15 DIAGNOSIS — Z6841 Body Mass Index (BMI) 40.0 and over, adult: Secondary | ICD-10-CM

## 2022-01-15 DIAGNOSIS — F17200 Nicotine dependence, unspecified, uncomplicated: Secondary | ICD-10-CM | POA: Insufficient documentation

## 2022-01-15 MED ORDER — LOSARTAN POTASSIUM 50 MG PO TABS: 50.0000 mg | ORAL_TABLET | Freq: Every day | ORAL | 1 refills | Status: DC

## 2022-01-15 NOTE — Assessment & Plan Note (Signed)
Screening labs ordered. ?Will call the pt with the results. ?

## 2022-01-15 NOTE — Progress Notes (Signed)
? ?New Patient Office Visit ? ?Subjective:  ?Patient ID: Denise Velez, female    DOB: May 21, 1966  Age: 56 y.o. MRN: [id number] ? ?CC:  ?Chief Complaint  ?Patient presents with  ? New Patient (Initial Visit)  ? ? ?HPI ?Denise Velez presents for establishing care. Patient had an injury at work in November 2021. The injury impacted her right knee, L4, L5 and S1. She had right knee surgery. Patient reports that she is managed for her pain at [facility].  ? ?Past Medical History:  ?Diagnosis Date  ? Arthritis   ? Patient denies medical problems   ? ? ?Past Surgical History:  ?Procedure Laterality Date  ? CESAREAN SECTION    ? times 2  ? KNEE ARTHROSCOPY WITH MEDIAL MENISECTOMY Right 05/08/2021  ? Procedure: RIGHT KNEE ARTHROSCOPY WITH MEDIAL MENISECTOMY;  Surgeon: Juanell Fairly, MD;  Location: [facility] ORS;  Service: Orthopedics;  Laterality: Right;  ? ? ?Family History  ?Problem Relation Age of Onset  ? Stroke Mother   ? Kidney disease Mother   ? Hypertension Mother   ? Diabetes Mother   ? Hypertension Brother   ? Diabetes Brother   ? ? ?Social History  ? ?Socioeconomic History  ? Marital status: Divorced  ?  Spouse name: Not on file  ? Number of children: 3  ? Years of education: Not on file  ? Highest education level: Not on file  ?Occupational History  ? Not on file  ?Tobacco Use  ? Smoking status: Every Day  ?  Packs/day: 0.50  ?  Types: Cigarettes  ? Smokeless tobacco: Never  ?Vaping Use  ? Vaping Use: Never used  ?Substance and Sexual Activity  ? Alcohol use: Yes  ?  Comment: occasionaly  ? Drug use: Yes  ?  Types: Marijuana  ?  Comment: occ  ? Sexual activity: Yes  ?  Birth control/protection: None  ?Other Topics Concern  ? Not on file  ?Social History Narrative  ? Not on file  ? ?Social Determinants of Health  ? ?Financial Resource Strain: Not on file  ?Food Insecurity: Not on file  ?Transportation Needs: Not on file  ?Physical Activity: Not on file  ?Stress: Not on file  ?Social Connections: Not on file   ?Intimate Partner Violence: Not on file  ? ? ?ROS ?Review of Systems  ?Constitutional:  Positive for activity change.  ?HENT:  Negative for congestion and ear pain.   ?Eyes:  Negative for discharge and redness.  ?Respiratory:  Negative for cough and shortness of breath.   ?Cardiovascular:  Negative for chest pain and leg swelling.  ?Gastrointestinal:  Negative for abdominal pain and blood in stool.  ?Genitourinary: Negative.   ?Musculoskeletal:  Positive for back pain.  ?Skin:  Negative for color change and pallor.  ?Neurological:  Negative for dizziness and headaches.  ?Psychiatric/Behavioral:  Negative for agitation, behavioral problems and confusion.   ? ?Objective:  ? ?Today's Vitals: BP (!) 185/92   Pulse 84   Ht 5\' 8"  (1.727 m)   Wt 283 lb 12.8 oz (128.7 kg)   BMI 43.15 kg/m?  ? ?Physical Exam ?Constitutional:   ?   Appearance: Normal appearance. She is obese.  ?HENT:  ?   Head: Normocephalic.  ?   Right Ear: Tympanic membrane normal.  ?   Left Ear: Tympanic membrane normal.  ?   Nose: Nose normal.  ?   Mouth/Throat:  ?   Mouth: Mucous membranes are moist.  ?  Dentition: Has dentures.  ?Eyes:  ?   Pupils: Pupils are equal, round, and reactive to light.  ?Cardiovascular:  ?   Rate and Rhythm: Normal rate and regular rhythm.  ?   Pulses: Normal pulses.  ?   Heart sounds: Normal heart sounds.  ?Pulmonary:  ?   Effort: Pulmonary effort is normal.  ?   Breath sounds: Normal breath sounds.  ?Abdominal:  ?   General: Bowel sounds are normal.  ?   Palpations: Abdomen is soft.  ?Musculoskeletal:     ?   General: No swelling or tenderness.  ?   Cervical back: Normal range of motion.  ?Skin: ?   General: Skin is warm.  ?   Capillary Refill: Capillary refill takes less than 2 seconds.  ?Neurological:  ?   General: No focal deficit present.  ?   Mental Status: She is alert and oriented to person, place, and time. Mental status is at baseline.  ?Psychiatric:     ?   Mood and Affect: Mood normal.     ?   Behavior:  Behavior normal.     ?   Thought Content: Thought content normal.     ?   Judgment: Judgment normal.  ? ? ?Assessment & Plan:  ? ?Problem List Items Addressed This Visit   ? ?  ? Other  ? Class 3 severe obesity with body mass index (BMI) of 40.0 to 44.9 in adult [facility])  ?  BMI 43.17 ?Advised pt to lose weight. ?Advised pt to follow heart healthy, low carbohydrates diet. ?Avoid trans fat. ?Follow a regular physical activity schedule.  ?  ?  ? Encounter to establish care with new doctor - Primary  ?  Screening labs ordered. ?Will call the pt with the results. ?  ?  ? Relevant Orders  ? Comprehensive metabolic panel  ? CBC with Differential/Platelet  ? Lipid panel  ? HgB A1c  ? Smoker  ?  Pt smoke a pack of cigarette a week. ?Advised pt to quit smoking. ?  ?  ? ?Other Visit Diagnoses   ? ? Cervical cancer screening      ? Relevant Orders  ? Ambulatory referral to Obstetrics / Gynecology  ? ?  ? ? ?Outpatient Encounter Medications as of 01/15/2022  ?Medication Sig  ? gabapentin (NEURONTIN) 300 MG capsule Take 300 mg by mouth 2 (two) times daily.  ? losartan (COZAAR) 50 MG tablet Take 1 tablet (50 mg total) by mouth daily.  ? traMADol (ULTRAM) 50 MG tablet Take 50 mg by mouth 2 (two) times daily.  ? [DISCONTINUED] aspirin EC 325 MG tablet Take 1 tablet (325 mg total) by mouth daily. (Patient not taking: Reported on 01/15/2022)  ? [DISCONTINUED] diphenhydramine-acetaminophen (TYLENOL PM) 25-500 MG TABS tablet Take 2 tablets by mouth at bedtime as needed (sleep/pain). (Patient not taking: Reported on 01/15/2022)  ? [DISCONTINUED] HYDROcodone-acetaminophen (NORCO) 5-325 MG tablet Take 1 tablet by mouth every 4 (four) hours as needed for moderate pain. (Patient not taking: Reported on 01/15/2022)  ? [DISCONTINUED] ondansetron (ZOFRAN) 4 MG tablet Take 1 tablet (4 mg total) by mouth every 8 (eight) hours as needed for nausea or vomiting. (Patient not taking: Reported on 01/15/2022)  ? ?No facility-administered encounter medications on  file as of 01/15/2022.  ? ? ?Follow-up: No follow-ups on file.  ? ?Kara Dies, NP ? ?

## 2022-01-15 NOTE — Assessment & Plan Note (Signed)
BMI 43.17 ?Advised pt to lose weight. ?Advised pt to follow heart healthy, low carbohydrates diet. ?Avoid trans fat. ?Follow a regular physical activity schedule.  ?

## 2022-01-15 NOTE — Assessment & Plan Note (Signed)
Pt smoke a pack of cigarette a week. ?Advised pt to quit smoking. ?

## 2022-01-16 LAB — COMPREHENSIVE METABOLIC PANEL
AG Ratio: 1.5 (calc) (ref 1.0–2.5)
ALT: 28 U/L (ref 6–29)
AST: 34 U/L (ref 10–35)
Albumin: 4.3 g/dL (ref 3.6–5.1)
Alkaline phosphatase (APISO): 147 U/L (ref 37–153)
BUN: 11 mg/dL (ref 7–25)
CO2: 26 mmol/L (ref 20–32)
Calcium: 9.7 mg/dL (ref 8.6–10.4)
Chloride: 102 mmol/L (ref 98–110)
Creat: 0.74 mg/dL (ref 0.50–1.03)
Globulin: 2.8 g/dL (calc) (ref 1.9–3.7)
Glucose, Bld: 262 mg/dL — ABNORMAL HIGH (ref 65–99)
Potassium: 4.1 mmol/L (ref 3.5–5.3)
Sodium: 137 mmol/L (ref 135–146)
Total Bilirubin: 0.5 mg/dL (ref 0.2–1.2)
Total Protein: 7.1 g/dL (ref 6.1–8.1)

## 2022-01-16 LAB — CBC WITH DIFFERENTIAL/PLATELET
Absolute Monocytes: 390 cells/uL (ref 200–950)
Basophils Absolute: 33 cells/uL (ref 0–200)
Basophils Relative: 0.4 %
Eosinophils Absolute: 158 cells/uL (ref 15–500)
Eosinophils Relative: 1.9 %
HCT: 41.1 % (ref 35.0–45.0)
Hemoglobin: 13.8 g/dL (ref 11.7–15.5)
Lymphs Abs: 2482 cells/uL (ref 850–3900)
MCH: 29.9 pg (ref 27.0–33.0)
MCHC: 33.6 g/dL (ref 32.0–36.0)
MCV: 89 fL (ref 80.0–100.0)
MPV: 11.7 fL (ref 7.5–12.5)
Monocytes Relative: 4.7 %
Neutro Abs: 5237 cells/uL (ref 1500–7800)
Neutrophils Relative %: 63.1 %
Platelets: 240 10*3/uL (ref 140–400)
RBC: 4.62 10*6/uL (ref 3.80–5.10)
RDW: 12.8 % (ref 11.0–15.0)
Total Lymphocyte: 29.9 %
WBC: 8.3 10*3/uL (ref 3.8–10.8)

## 2022-01-16 LAB — HEMOGLOBIN A1C
Hgb A1c MFr Bld: 10.5 % of total Hgb — ABNORMAL HIGH (ref <5.7)
Mean Plasma Glucose: 255 mg/dL
eAG (mmol/L): 14.1 mmol/L

## 2022-01-16 LAB — LIPID PANEL
Cholesterol: 343 mg/dL — ABNORMAL HIGH (ref <200)
HDL: 64 mg/dL (ref >=50)
LDL Cholesterol (Calc): 253 mg/dL (calc) — ABNORMAL HIGH
Non-HDL Cholesterol (Calc): 279 mg/dL (calc) — ABNORMAL HIGH (ref <130)
Total CHOL/HDL Ratio: 5.4 (calc) — ABNORMAL HIGH (ref <5.0)
Triglycerides: 115 mg/dL (ref <150)

## 2022-01-16 NOTE — Progress Notes (Signed)
Please call pt with the abnormal result and make a follow up appointment.

## 2022-01-21 NOTE — Progress Notes (Signed)
? ?Established Patient Office Visit ? ?Subjective:  ?Patient ID: Denise Velez, female    DOB: [birth year]  Age: 56 y.o. MRN: [id number] ? ?CC:  ?Chief Complaint  ?Patient presents with  ? Lab Results  ? ? ? ?HPI ? ?Denise Velez presents for lab review. Her blood pressure is 232/118. She does not have any symptoms such as shortness of breath, chest pain or numbness or weakness. Her Hgb A1c 10.5, elevated lipids results. ? ?HPI  ? ?Past Medical History:  ?Diagnosis Date  ? Arthritis   ? Patient denies medical problems   ? ? ?Past Surgical History:  ?Procedure Laterality Date  ? CESAREAN SECTION    ? times 2  ? KNEE ARTHROSCOPY WITH MEDIAL MENISECTOMY Right 05/08/2021  ? Procedure: RIGHT KNEE ARTHROSCOPY WITH MEDIAL MENISECTOMY;  Surgeon: Thornton Park, MD;  Location: [facility] ORS;  Service: Orthopedics;  Laterality: Right;  ? ? ?Family History  ?Problem Relation Age of Onset  ? Stroke Mother   ? Kidney disease Mother   ? Hypertension Mother   ? Diabetes Mother   ? Hypertension Brother   ? Diabetes Brother   ? ? ?Social History  ? ?Socioeconomic History  ? Marital status: Divorced  ?  Spouse name: Not on file  ? Number of children: 3  ? Years of education: Not on file  ? Highest education level: Not on file  ?Occupational History  ? Not on file  ?Tobacco Use  ? Smoking status: Every Day  ?  Packs/day: 0.50  ?  Types: Cigarettes  ? Smokeless tobacco: Never  ?Vaping Use  ? Vaping Use: Never used  ?Substance and Sexual Activity  ? Alcohol use: Yes  ?  Comment: occasionaly  ? Drug use: Yes  ?  Types: Marijuana  ?  Comment: occ  ? Sexual activity: Yes  ?  Birth control/protection: None  ?Other Topics Concern  ? Not on file  ?Social History Narrative  ? Not on file  ? ?Social Determinants of Health  ? ?Financial Resource Strain: Not on file  ?Food Insecurity: Not on file  ?Transportation Needs: Not on file  ?Physical Activity: Not on file  ?Stress: Not on file  ?Social Connections: Not on file  ?Intimate Partner Violence:  Not on file  ? ? ? ?Outpatient Medications Prior to Visit  ?Medication Sig Dispense Refill  ? gabapentin (NEURONTIN) 300 MG capsule Take 300 mg by mouth 2 (two) times daily.    ? losartan (COZAAR) 50 MG tablet Take 1 tablet (50 mg total) by mouth daily. 90 tablet 1  ? traMADol (ULTRAM) 50 MG tablet Take 50 mg by mouth 2 (two) times daily.    ? ?No facility-administered medications prior to visit.  ? ? ?Allergies  ?Allergen Reactions  ? Sulfa Antibiotics Rash  ? ? ?ROS ?Review of Systems  ?Constitutional: Negative.   ?HENT: Negative.    ?Eyes: Negative.   ?Respiratory:  Negative for cough, chest tightness and shortness of breath.   ?Cardiovascular:  Negative for chest pain and palpitations.  ?Gastrointestinal: Negative.   ?Endocrine: Negative.   ?Genitourinary: Negative.   ?Musculoskeletal:  Positive for back pain.  ?Skin: Negative.   ?Neurological:  Negative for dizziness, light-headedness, numbness and headaches.  ?Psychiatric/Behavioral:  Negative for agitation, behavioral problems and confusion.   ? ?  ?Objective:  ?  ?Physical Exam ?Constitutional:   ?   Appearance: Normal appearance. She is normal weight.  ?HENT:  ?   Head: Normocephalic.  ?  Right Ear: Tympanic membrane normal.  ?   Left Ear: Tympanic membrane normal.  ?   Nose: Nose normal.  ?   Mouth/Throat:  ?   Mouth: Mucous membranes are moist.  ?Eyes:  ?   Pupils: Pupils are equal, round, and reactive to light.  ?Cardiovascular:  ?   Rate and Rhythm: Normal rate and regular rhythm.  ?   Pulses: Normal pulses.  ?   Heart sounds: Normal heart sounds.  ?Pulmonary:  ?   Effort: Pulmonary effort is normal.  ?Abdominal:  ?   General: Bowel sounds are normal.  ?   Palpations: Abdomen is soft.  ?Musculoskeletal:     ?   General: Normal range of motion.  ?   Cervical back: Normal range of motion.  ?Skin: ?   General: Skin is warm.  ?   Capillary Refill: Capillary refill takes less than 2 seconds.  ?Neurological:  ?   General: No focal deficit present.  ?    Mental Status: She is oriented to person, place, and time. Mental status is at baseline.  ?Psychiatric:     ?   Mood and Affect: Mood normal.     ?   Behavior: Behavior normal.     ?   Thought Content: Thought content normal.     ?   Judgment: Judgment normal.  ? ? ?BP (!) 232/118   Pulse 88   Ht '5\' 8"'  (1.727 m)   Wt 286 lb (129.7 kg)   BMI 43.49 kg/m?  ?Wt Readings from Last 3 Encounters:  ?01/22/22 286 lb (129.7 kg)  ?01/15/22 283 lb 12.8 oz (128.7 kg)  ?05/08/21 275 lb (124.7 kg)  ? ? ? ?Health Maintenance Due  ?Topic Date Due  ? COVID-19 Vaccine (1) Never done  ? FOOT EXAM  Never done  ? OPHTHALMOLOGY EXAM  Never done  ? HIV Screening  Never done  ? Hepatitis C Screening  Never done  ? TETANUS/TDAP  Never done  ? PAP SMEAR-Modifier  Never done  ? Zoster Vaccines- Shingrix (1 of 2) Never done  ? ? ?There are no preventive care reminders to display for this patient. ? ?No results found for: TSH ?Lab Results  ?Component Value Date  ? WBC 8.3 01/15/2022  ? HGB 13.8 01/15/2022  ? HCT 41.1 01/15/2022  ? MCV 89.0 01/15/2022  ? PLT 240 01/15/2022  ? ?Lab Results  ?Component Value Date  ? NA 137 01/15/2022  ? K 4.1 01/15/2022  ? CO2 26 01/15/2022  ? GLUCOSE 262 (H) 01/15/2022  ? BUN 11 01/15/2022  ? CREATININE 0.74 01/15/2022  ? BILITOT 0.5 01/15/2022  ? AST 34 01/15/2022  ? ALT 28 01/15/2022  ? PROT 7.1 01/15/2022  ? CALCIUM 9.7 01/15/2022  ? ANIONGAP 8 05/06/2021  ? ?Lab Results  ?Component Value Date  ? CHOL 343 (H) 01/15/2022  ? ?Lab Results  ?Component Value Date  ? HDL 64 01/15/2022  ? ?Lab Results  ?Component Value Date  ? LDLCALC 253 (H) 01/15/2022  ? ?Lab Results  ?Component Value Date  ? TRIG 115 01/15/2022  ? ?Lab Results  ?Component Value Date  ? CHOLHDL 5.4 (H) 01/15/2022  ? ?Lab Results  ?Component Value Date  ? HGBA1C 10.5 (H) 01/15/2022  ? ? ?  ?Assessment & Plan:  ? ?Problem List Items Addressed This Visit   ? ?  ? Cardiovascular and Mediastinum  ? Primary hypertension  ?  Pt blood pressure not  controlled.  ?  Advise pt to limit the intake of sodium. ?Advise pt to increase the intake of water, and perform regular exercise. ?Increased the dosage of losartan 50 mg to 100 mg. ?Will continue to monitor.   ?  ? ? ?  ?  ? Relevant Medications  ? rosuvastatin (CRESTOR) 5 MG tablet  ?  ? Endocrine  ? Type 2 diabetes mellitus without complication, without long-term current use of insulin ([place]) - Primary  ?  Hg A1C 10.5 ?Advised pt to check the BS regularly, make a log and bring to next appointment.  ?Advised pt to monitor diet. ?Advised pt to eat variety of food including fruits, vegetables, whole grains, complex carbohydrates and proteins.  ? ?  ?  ? Relevant Medications  ? metFORMIN (GLUCOPHAGE) 500 MG tablet  ? rosuvastatin (CRESTOR) 5 MG tablet  ?  ? Other  ? Class 3 severe obesity with body mass index (BMI) of 40.0 to 44.9 in adult [facility])  ?  BMI 43.49 ?Advised pt to lose weight. ?  ?  ? Relevant Medications  ? metFORMIN (GLUCOPHAGE) 500 MG tablet  ? Hyperlipidemia  ?  LDL 253, Cholesterol 343. ?Started pt on  Rosuvastatin  5 mg daily ?  ?  ? Relevant Medications  ? rosuvastatin (CRESTOR) 5 MG tablet  ? ?Advised patient to call the office or go to the ED if chest pain, SOB, difficulty breathing or weakness of one side.  ? ? ?Meds ordered this encounter  ?Medications  ? metFORMIN (GLUCOPHAGE) 500 MG tablet  ?  Sig: Take 1 tablet (500 mg total) by mouth 2 (two) times daily with a meal.  ?  Dispense:  180 tablet  ?  Refill:  3  ? rosuvastatin (CRESTOR) 5 MG tablet  ?  Sig: Take 1 tablet (5 mg total) by mouth daily.  ?  Dispense:  90 tablet  ?  Refill:  3  ? Blood Pressure Monitoring (BLOOD PRESSURE CUFF) MISC  ?  Sig: 1 each by Does not apply route as needed.  ?  Dispense:  1 each  ?  Refill:  0  ? blood glucose meter kit and supplies KIT  ?  Sig: Dispense based on patient and insurance preference. Check blood sugar twice a day  ?  Dispense:  1 each  ?  Refill:  0  ?  Order Specific Question:   Number of strips  ?   Answer:   100  ?  Order Specific Question:   Number of lancets  ?  Answer:   100  ? ? ? ?Follow-up: No follow-ups on file.  ? ? ?Theresia Lo, NP ?

## 2022-01-22 ENCOUNTER — Encounter: Payer: Self-pay | Admitting: Nurse Practitioner

## 2022-01-22 ENCOUNTER — Ambulatory Visit (INDEPENDENT_AMBULATORY_CARE_PROVIDER_SITE_OTHER): Payer: 59 | Admitting: Nurse Practitioner

## 2022-01-22 VITALS — BP 232/118 | HR 88 | Ht 68.0 in | Wt 286.0 lb

## 2022-01-22 DIAGNOSIS — I10 Essential (primary) hypertension: Secondary | ICD-10-CM

## 2022-01-22 DIAGNOSIS — E785 Hyperlipidemia, unspecified: Secondary | ICD-10-CM

## 2022-01-22 DIAGNOSIS — E119 Type 2 diabetes mellitus without complications: Secondary | ICD-10-CM

## 2022-01-22 DIAGNOSIS — Z6841 Body Mass Index (BMI) 40.0 and over, adult: Secondary | ICD-10-CM

## 2022-01-22 MED ORDER — METFORMIN HCL 500 MG PO TABS: 500.0000 mg | ORAL_TABLET | Freq: Two times a day (BID) | ORAL | 3 refills | Status: DC

## 2022-01-22 MED ORDER — ROSUVASTATIN CALCIUM 5 MG PO TABS: 5.0000 mg | ORAL_TABLET | Freq: Every day | ORAL | 3 refills | Status: DC

## 2022-01-22 MED ORDER — BLOOD PRESSURE CUFF MISC: 1.0000 | 0 refills | Status: DC | PRN

## 2022-01-22 MED ORDER — BLOOD GLUCOSE MONITOR KIT: PACK | 0 refills | Status: AC

## 2022-01-25 DIAGNOSIS — E785 Hyperlipidemia, unspecified: Secondary | ICD-10-CM | POA: Insufficient documentation

## 2022-01-25 DIAGNOSIS — E1169 Type 2 diabetes mellitus with other specified complication: Secondary | ICD-10-CM | POA: Insufficient documentation

## 2022-01-25 DIAGNOSIS — E1159 Type 2 diabetes mellitus with other circulatory complications: Secondary | ICD-10-CM | POA: Insufficient documentation

## 2022-01-25 DIAGNOSIS — E119 Type 2 diabetes mellitus without complications: Secondary | ICD-10-CM | POA: Insufficient documentation

## 2022-01-25 DIAGNOSIS — I10 Essential (primary) hypertension: Secondary | ICD-10-CM | POA: Insufficient documentation

## 2022-01-25 NOTE — Assessment & Plan Note (Signed)
Pt blood pressure not controlled.  ?Advise pt to limit the intake of sodium. ?Advise pt to increase the intake of water, and perform regular exercise. ?Increased the dosage of losartan 50 mg to 100 mg. ?Will continue to monitor.   ?  ? ? ?

## 2022-01-25 NOTE — Assessment & Plan Note (Signed)
LDL 253, Cholesterol 343. ?Started pt on  Rosuvastatin  5 mg daily ?

## 2022-01-25 NOTE — Assessment & Plan Note (Signed)
BMI 43.49 ?Advised pt to lose weight. ?

## 2022-01-25 NOTE — Assessment & Plan Note (Signed)
Hg A1C 10.5 ?Advised pt to check the BS regularly, make a log and bring to next appointment.  ?Advised pt to monitor diet. ?Advised pt to eat variety of food including fruits, vegetables, whole grains, complex carbohydrates and proteins.  ? ?

## 2022-01-30 ENCOUNTER — Ambulatory Visit (INDEPENDENT_AMBULATORY_CARE_PROVIDER_SITE_OTHER): Payer: 59 | Admitting: Nurse Practitioner

## 2022-01-30 ENCOUNTER — Encounter: Payer: Self-pay | Admitting: Nurse Practitioner

## 2022-01-30 ENCOUNTER — Other Ambulatory Visit: Payer: Self-pay

## 2022-01-30 VITALS — BP 180/92 | HR 87 | Ht 68.0 in | Wt 279.6 lb

## 2022-01-30 DIAGNOSIS — Z6841 Body Mass Index (BMI) 40.0 and over, adult: Secondary | ICD-10-CM

## 2022-01-30 DIAGNOSIS — I10 Essential (primary) hypertension: Secondary | ICD-10-CM | POA: Diagnosis not present

## 2022-01-30 NOTE — Assessment & Plan Note (Signed)
Patient has lost 6 lb in a week. ?Her BMI 42.51 ?Encouraged patient to watch diet and follow the regular physical activity schedule. ?

## 2022-01-30 NOTE — Progress Notes (Signed)
? ?Established Patient Office Visit ? ?Subjective:  ?Patient ID: Denise Velez, female    DOB: 06-Nov-1966  Age: 56 y.o. MRN: [id number] ? ?CC:  ?Chief Complaint  ?Patient presents with  ? Hypertension  ?  Patient is here for a repeat blood pressure check.  ? ? ? ?HPI ? ?Denise Velez presents for one week blood pressure check. She did not check the bp at home.  ? ?HPI  ? ?Past Medical History:  ?Diagnosis Date  ? Arthritis   ? Patient denies medical problems   ? ? ?Past Surgical History:  ?Procedure Laterality Date  ? CESAREAN SECTION    ? times 2  ? KNEE ARTHROSCOPY WITH MEDIAL MENISECTOMY Right 05/08/2021  ? Procedure: RIGHT KNEE ARTHROSCOPY WITH MEDIAL MENISECTOMY;  Surgeon: Thornton Park, MD;  Location: [facility] ORS;  Service: Orthopedics;  Laterality: Right;  ? ? ?Family History  ?Problem Relation Age of Onset  ? Stroke Mother   ? Kidney disease Mother   ? Hypertension Mother   ? Diabetes Mother   ? Hypertension Brother   ? Diabetes Brother   ? ? ?Social History  ? ?Socioeconomic History  ? Marital status: Divorced  ?  Spouse name: Not on file  ? Number of children: 3  ? Years of education: Not on file  ? Highest education level: Not on file  ?Occupational History  ? Not on file  ?Tobacco Use  ? Smoking status: Former  ?  Packs/day: 0.50  ?  Types: Cigarettes  ?  Quit date: 01/19/2022  ?  Years since quitting: 0.0  ? Smokeless tobacco: Never  ?Vaping Use  ? Vaping Use: Never used  ?Substance and Sexual Activity  ? Alcohol use: Yes  ?  Comment: occasionaly  ? Drug use: Yes  ?  Types: Marijuana  ?  Comment: occ  ? Sexual activity: Yes  ?  Birth control/protection: None  ?Other Topics Concern  ? Not on file  ?Social History Narrative  ? Not on file  ? ?Social Determinants of Health  ? ?Financial Resource Strain: Not on file  ?Food Insecurity: Not on file  ?Transportation Needs: Not on file  ?Physical Activity: Not on file  ?Stress: Not on file  ?Social Connections: Not on file  ?Intimate Partner Violence: Not on file   ? ? ? ?Outpatient Medications Prior to Visit  ?Medication Sig Dispense Refill  ? blood glucose meter kit and supplies KIT Dispense based on patient and insurance preference. Check blood sugar twice a day 1 each 0  ? Blood Pressure Monitoring (BLOOD PRESSURE CUFF) MISC 1 each by Does not apply route as needed. 1 each 0  ? gabapentin (NEURONTIN) 300 MG capsule Take 300 mg by mouth 2 (two) times daily.    ? losartan (COZAAR) 50 MG tablet Take 1 tablet (50 mg total) by mouth daily. 90 tablet 1  ? metFORMIN (GLUCOPHAGE) 500 MG tablet Take 1 tablet (500 mg total) by mouth 2 (two) times daily with a meal. 180 tablet 3  ? rosuvastatin (CRESTOR) 5 MG tablet Take 1 tablet (5 mg total) by mouth daily. 90 tablet 3  ? traMADol (ULTRAM) 50 MG tablet Take 50 mg by mouth 2 (two) times daily.    ? ?No facility-administered medications prior to visit.  ? ? ?Allergies  ?Allergen Reactions  ? Sulfa Antibiotics Rash  ? ? ?ROS ?Review of Systems  ?Constitutional: Negative.   ?HENT: Negative.    ?Eyes: Negative.   ?Respiratory:  Negative  for cough, shortness of breath and wheezing.   ?Cardiovascular:  Negative for chest pain and palpitations.  ?Gastrointestinal:  Negative for abdominal pain and constipation.  ?Genitourinary:  Negative for difficulty urinating, hematuria and vaginal bleeding.  ?Musculoskeletal: Negative.   ?Skin:  Negative for color change and wound.  ?Neurological:  Negative for dizziness, light-headedness and headaches.  ?Psychiatric/Behavioral:  Negative for agitation, behavioral problems and confusion.   ? ?  ?Objective:  ?  ?Physical Exam ? ?BP (!) 180/92   Pulse 87   Ht '5\' 8"'  (1.727 m)   Wt 279 lb 9.6 oz (126.8 kg)   BMI 42.51 kg/m?  ?Wt Readings from Last 3 Encounters:  ?01/30/22 279 lb 9.6 oz (126.8 kg)  ?01/22/22 286 lb (129.7 kg)  ?01/15/22 283 lb 12.8 oz (128.7 kg)  ? ? ? ?Health Maintenance Due  ?Topic Date Due  ? COVID-19 Vaccine (1) Never done  ? FOOT EXAM  Never done  ? OPHTHALMOLOGY EXAM  Never done  ?  HIV Screening  Never done  ? Hepatitis C Screening  Never done  ? TETANUS/TDAP  Never done  ? PAP SMEAR-Modifier  Never done  ? Zoster Vaccines- Shingrix (1 of 2) Never done  ? ? ?There are no preventive care reminders to display for this patient. ? ?No results found for: TSH ?Lab Results  ?Component Value Date  ? WBC 8.3 01/15/2022  ? HGB 13.8 01/15/2022  ? HCT 41.1 01/15/2022  ? MCV 89.0 01/15/2022  ? PLT 240 01/15/2022  ? ?Lab Results  ?Component Value Date  ? NA 137 01/15/2022  ? K 4.1 01/15/2022  ? CO2 26 01/15/2022  ? GLUCOSE 262 (H) 01/15/2022  ? BUN 11 01/15/2022  ? CREATININE 0.74 01/15/2022  ? BILITOT 0.5 01/15/2022  ? AST 34 01/15/2022  ? ALT 28 01/15/2022  ? PROT 7.1 01/15/2022  ? CALCIUM 9.7 01/15/2022  ? ANIONGAP 8 05/06/2021  ? ?Lab Results  ?Component Value Date  ? CHOL 343 (H) 01/15/2022  ? ?Lab Results  ?Component Value Date  ? HDL 64 01/15/2022  ? ?Lab Results  ?Component Value Date  ? LDLCALC 253 (H) 01/15/2022  ? ?Lab Results  ?Component Value Date  ? TRIG 115 01/15/2022  ? ?Lab Results  ?Component Value Date  ? CHOLHDL 5.4 (H) 01/15/2022  ? ?Lab Results  ?Component Value Date  ? HGBA1C 10.5 (H) 01/15/2022  ? ? ?  ?Assessment & Plan:  ? ?Problem List Items Addressed This Visit   ? ?  ? Cardiovascular and Mediastinum  ? Primary hypertension - Primary  ?  BP 180/92 ?Advise pt to limit the intake of sodium. ?Advise pt to increase the intake of water and perform regular exercise.  ?Advise pt to measure the BP at home and bring it to the next appointment.  ?Increased her losartan to 100 mg daily.  ?Will continue to monitor.  ? ? ?  ?  ?  ? Other  ? Class 3 severe obesity with body mass index (BMI) of 40.0 to 44.9 in adult [facility])  ?  Patient has lost 6 lb in a week. ?Her BMI 42.51 ?Encouraged patient to watch diet and follow the regular physical activity schedule. ?  ?  ? ? ? ?No orders of the defined types were placed in this encounter. ? ? ? ?Follow-up: Return in about 2 years (around 01/31/2024).   ? ? ?Theresia Lo, NP ?

## 2022-01-30 NOTE — Assessment & Plan Note (Signed)
BP 180/92 ?Advise pt to limit the intake of sodium. ?Advise pt to increase the intake of water and perform regular exercise.  ?Advise pt to measure the BP at home and bring it to the next appointment.  ?Increased her losartan to 100 mg daily.  ?Will continue to monitor.  ? ? ?

## 2022-01-31 ENCOUNTER — Other Ambulatory Visit: Payer: Self-pay | Admitting: Nurse Practitioner

## 2022-02-13 ENCOUNTER — Ambulatory Visit (INDEPENDENT_AMBULATORY_CARE_PROVIDER_SITE_OTHER): Payer: 59 | Admitting: Nurse Practitioner

## 2022-02-13 ENCOUNTER — Encounter: Payer: Self-pay | Admitting: Nurse Practitioner

## 2022-02-13 VITALS — BP 180/77 | HR 82 | Ht 68.0 in | Wt 278.1 lb

## 2022-02-13 DIAGNOSIS — Z6841 Body Mass Index (BMI) 40.0 and over, adult: Secondary | ICD-10-CM

## 2022-02-13 DIAGNOSIS — I10 Essential (primary) hypertension: Secondary | ICD-10-CM | POA: Diagnosis not present

## 2022-02-13 DIAGNOSIS — E119 Type 2 diabetes mellitus without complications: Secondary | ICD-10-CM | POA: Diagnosis not present

## 2022-02-13 LAB — GLUCOSE, POCT (MANUAL RESULT ENTRY): POC Glucose: 109 mg/dl — AB (ref 70–99)

## 2022-02-13 MED ORDER — LOSARTAN POTASSIUM 100 MG PO TABS: 100.0000 mg | ORAL_TABLET | Freq: Every day | ORAL | 2 refills | Status: DC

## 2022-02-13 MED ORDER — DAPAGLIFLOZIN PROPANEDIOL 5 MG PO TABS: 5.0000 mg | ORAL_TABLET | Freq: Every day | ORAL | 2 refills | Status: DC

## 2022-02-13 MED ORDER — AMLODIPINE BESYLATE 10 MG PO TABS: 5.0000 mg | ORAL_TABLET | Freq: Every day | ORAL | 2 refills | Status: DC

## 2022-02-13 NOTE — Progress Notes (Signed)
? ?Established Patient Office Visit ? ?Subjective:  ?Patient ID: Denise Velez, female    DOB: [birth year]  Age: 56 y.o. MRN: [id number] ? ?CC:  ?Chief Complaint  ?Patient presents with  ? Follow-up  ?  Patient is here for a follow up on her diabetes and blood pressure.  ? ? ? ?HPI ? ?Denise Velez presents for two week blood pressure and blood sugar follow up. Average BP at home is 500-370 systolic and 48-88 dystolic. BS at home ranges from 170 -180 ? ? ?HPI  ? ?Past Medical History:  ?Diagnosis Date  ? Arthritis   ? Patient denies medical problems   ? ? ?Past Surgical History:  ?Procedure Laterality Date  ? CESAREAN SECTION    ? times 2  ? KNEE ARTHROSCOPY WITH MEDIAL MENISECTOMY Right 05/08/2021  ? Procedure: RIGHT KNEE ARTHROSCOPY WITH MEDIAL MENISECTOMY;  Surgeon: Thornton Park, MD;  Location: [facility] ORS;  Service: Orthopedics;  Laterality: Right;  ? ? ?Family History  ?Problem Relation Age of Onset  ? Stroke Mother   ? Kidney disease Mother   ? Hypertension Mother   ? Diabetes Mother   ? Hypertension Brother   ? Diabetes Brother   ? ? ?Social History  ? ?Socioeconomic History  ? Marital status: Divorced  ?  Spouse name: Not on file  ? Number of children: 3  ? Years of education: Not on file  ? Highest education level: Not on file  ?Occupational History  ? Not on file  ?Tobacco Use  ? Smoking status: Former  ?  Packs/day: 0.50  ?  Types: Cigarettes  ?  Quit date: 01/19/2022  ?  Years since quitting: 0.0  ? Smokeless tobacco: Never  ?Vaping Use  ? Vaping Use: Never used  ?Substance and Sexual Activity  ? Alcohol use: Yes  ?  Comment: occasionaly  ? Drug use: Yes  ?  Types: Marijuana  ?  Comment: occ  ? Sexual activity: Yes  ?  Birth control/protection: None  ?Other Topics Concern  ? Not on file  ?Social History Narrative  ? Not on file  ? ?Social Determinants of Health  ? ?Financial Resource Strain: Not on file  ?Food Insecurity: Not on file  ?Transportation Needs: Not on file  ?Physical Activity: Not on file   ?Stress: Not on file  ?Social Connections: Not on file  ?Intimate Partner Violence: Not on file  ? ? ? ?Outpatient Medications Prior to Visit  ?Medication Sig Dispense Refill  ? blood glucose meter kit and supplies KIT Dispense based on patient and insurance preference. Check blood sugar twice a day 1 each 0  ? Blood Pressure Monitoring (BLOOD PRESSURE CUFF) MISC 1 each by Does not apply route as needed. 1 each 0  ? gabapentin (NEURONTIN) 300 MG capsule Take 300 mg by mouth 2 (two) times daily.    ? metFORMIN (GLUCOPHAGE) 500 MG tablet Take 1 tablet (500 mg total) by mouth 2 (two) times daily with a meal. 180 tablet 3  ? Microlet Lancets MISC USE TO CHECK SUGAR TWICE DAILY 100 each 3  ? rosuvastatin (CRESTOR) 5 MG tablet Take 1 tablet (5 mg total) by mouth daily. 90 tablet 3  ? traMADol (ULTRAM) 50 MG tablet Take 50 mg by mouth 2 (two) times daily.    ? losartan (COZAAR) 50 MG tablet Take 1 tablet (50 mg total) by mouth daily. 90 tablet 1  ? ?No facility-administered medications prior to visit.  ? ? ?Allergies  ?  Allergen Reactions  ? Sulfa Antibiotics Rash  ? ? ?ROS ?Review of Systems  ?Constitutional: Negative.   ?HENT: Negative.    ?Eyes: Negative.   ?Respiratory:  Negative for cough, shortness of breath and wheezing.   ?Cardiovascular:  Negative for chest pain and palpitations.  ?Gastrointestinal:  Negative for abdominal pain and constipation.  ?Genitourinary:  Negative for difficulty urinating, hematuria and vaginal bleeding.  ?Musculoskeletal:  Positive for back pain.  ?Skin:  Negative for color change and wound.  ?Neurological:  Negative for dizziness, light-headedness and headaches.  ?Psychiatric/Behavioral:  Negative for agitation, behavioral problems and confusion.   ? ?  ?Objective:  ?  ?Physical Exam ?Constitutional:   ?   Appearance: Normal appearance. She is obese.  ?HENT:  ?   Head: Normocephalic.  ?   Right Ear: Tympanic membrane normal.  ?   Left Ear: Tympanic membrane normal.  ?   Nose: Nose  normal.  ?   Mouth/Throat:  ?   Mouth: Mucous membranes are moist.  ?Eyes:  ?   Extraocular Movements: Extraocular movements intact.  ?   Conjunctiva/sclera: Conjunctivae normal.  ?   Pupils: Pupils are equal, round, and reactive to light.  ?Cardiovascular:  ?   Rate and Rhythm: Normal rate and regular rhythm.  ?   Pulses: Normal pulses.  ?   Heart sounds: Normal heart sounds.  ?Pulmonary:  ?   Effort: Pulmonary effort is normal.  ?   Breath sounds: Normal breath sounds.  ?Abdominal:  ?   General: Bowel sounds are normal.  ?   Palpations: Abdomen is soft.  ?Musculoskeletal:     ?   General: Normal range of motion.  ?   Cervical back: Normal range of motion and neck supple.  ?Skin: ?   General: Skin is warm.  ?   Capillary Refill: Capillary refill takes less than 2 seconds.  ?Neurological:  ?   General: No focal deficit present.  ?   Mental Status: She is alert and oriented to person, place, and time. Mental status is at baseline.  ?Psychiatric:     ?   Mood and Affect: Mood normal.     ?   Behavior: Behavior normal.     ?   Thought Content: Thought content normal.     ?   Judgment: Judgment normal.  ? ? ?BP (!) 180/77   Pulse 82   Ht _0  (1.727 m)   Wt 278 lb 1.6 oz (126.1 kg)   BMI 42.28 kg/m?  ?Wt Readings from Last 3 Encounters:  ?02/13/22 278 lb 1.6 oz (126.1 kg)  ?01/30/22 279 lb 9.6 oz (126.8 kg)  ?01/22/22 286 lb (129.7 kg)  ? ? ? ?Health Maintenance Due  ?Topic Date Due  ? COVID-19 Vaccine (1) Never done  ? FOOT EXAM  Never done  ? OPHTHALMOLOGY EXAM  Never done  ? URINE MICROALBUMIN  Never done  ? HIV Screening  Never done  ? Hepatitis C Screening  Never done  ? TETANUS/TDAP  Never done  ? PAP SMEAR-Modifier  Never done  ? Zoster Vaccines- Shingrix (1 of 2) Never done  ? ? ?There are no preventive care reminders to display for this patient. ? ?No results found for: TSH ?Lab Results  ?Component Value Date  ? WBC 8.3 01/15/2022  ? HGB 13.8 01/15/2022  ? HCT 41.1 01/15/2022  ? MCV 89.0 01/15/2022  ? PLT  240 01/15/2022  ? ?Lab Results  ?Component Value Date  ?  NA 137 01/15/2022  ? K 4.1 01/15/2022  ? CO2 26 01/15/2022  ? GLUCOSE 262 (H) 01/15/2022  ? BUN 11 01/15/2022  ? CREATININE 0.74 01/15/2022  ? BILITOT 0.5 01/15/2022  ? AST 34 01/15/2022  ? ALT 28 01/15/2022  ? PROT 7.1 01/15/2022  ? CALCIUM 9.7 01/15/2022  ? ANIONGAP 8 05/06/2021  ? ?Lab Results  ?Component Value Date  ? CHOL 343 (H) 01/15/2022  ? ?Lab Results  ?Component Value Date  ? HDL 64 01/15/2022  ? ?Lab Results  ?Component Value Date  ? LDLCALC 253 (H) 01/15/2022  ? ?Lab Results  ?Component Value Date  ? TRIG 115 01/15/2022  ? ?Lab Results  ?Component Value Date  ? CHOLHDL 5.4 (H) 01/15/2022  ? ?Lab Results  ?Component Value Date  ? HGBA1C 10.5 (H) 01/15/2022  ? ? ?  ?Assessment & Plan:  ? ?Problem List Items Addressed This Visit   ? ?  ? Cardiovascular and Mediastinum  ? Primary hypertension  ?  BP 180/77 in the office today. ?Started her on amlodipine 5 mg once daily. ?Continue taking losartan 100 mg once daily. ?Will continue to monitor. ?  ?  ? Relevant Medications  ? amLODipine (NORVASC) 10 MG tablet  ? losartan (COZAAR) 100 MG tablet  ?  ? Endocrine  ? Type 2 diabetes mellitus without complication, without long-term current use of insulin ([place]) - Primary  ?  Patient BS in the office 109 ?Patient BS at home ranges from 170-180. ?Started her on farxiga 5 mg ?Continue metformin 500 mg twice daily. ?Will continue to monitor. ?Referral sent for Nutrition and diabetic services at [facility]. ? ?  ?  ? Relevant Medications  ? losartan (COZAAR) 100 MG tablet  ? dapagliflozin propanediol (FARXIGA) 5 MG TABS tablet  ? Other Relevant Orders  ? POCT glucose (manual entry) (Completed)  ? Referral to Nutrition and Diabetes Services  ?  ? Other  ? Morbid obesity with BMI of 40.0-44.9, adult ([place])  ?  BMI 42.28 ?Advised pt to lose weight. ?Advised patient to avoid trans fat, fatty and fried food. ?Follow a regular physical activity schedule. ?Went over the risk of  chronic diseases with increased weight.   ?Patient is planning for bariatric surgery.  ? ? ? ?  ?  ? Relevant Medications  ? dapagliflozin propanediol (FARXIGA) 5 MG TABS tablet  ? ? ?Meds ordered this encounter  ?Me

## 2022-02-13 NOTE — Assessment & Plan Note (Signed)
BP 180/77 in the office today. ?Started her on amlodipine 5 mg once daily. ?Continue taking losartan 100 mg once daily. ?Will continue to monitor. ?

## 2022-02-13 NOTE — Assessment & Plan Note (Addendum)
Patient BS in the office 109 ?Patient BS at home ranges from 170-180. ?Started her on farxiga 5 mg ?Continue metformin 500 mg twice daily. ?Will continue to monitor. ?Referral sent for Nutrition and diabetic services at [facility]. ? ?

## 2022-02-13 NOTE — Assessment & Plan Note (Signed)
BMI 42.28 ?Advised pt to lose weight. ?Advised patient to avoid trans fat, fatty and fried food. ?Follow a regular physical activity schedule. ?Went over the risk of chronic diseases with increased weight.   ?Patient is planning for bariatric surgery.  ? ? ? ?

## 2022-02-16 ENCOUNTER — Other Ambulatory Visit: Payer: Self-pay

## 2022-02-16 DIAGNOSIS — I10 Essential (primary) hypertension: Secondary | ICD-10-CM

## 2022-02-16 DIAGNOSIS — E119 Type 2 diabetes mellitus without complications: Secondary | ICD-10-CM

## 2022-02-16 MED ORDER — LOSARTAN POTASSIUM 100 MG PO TABS: 100.0000 mg | ORAL_TABLET | Freq: Every day | ORAL | 2 refills | Status: DC

## 2022-02-16 MED ORDER — AMLODIPINE BESYLATE 10 MG PO TABS: 5.0000 mg | ORAL_TABLET | Freq: Every day | ORAL | 2 refills | Status: DC

## 2022-02-16 MED ORDER — DAPAGLIFLOZIN PROPANEDIOL 5 MG PO TABS: 5.0000 mg | ORAL_TABLET | Freq: Every day | ORAL | 2 refills | Status: DC

## 2022-02-19 ENCOUNTER — Encounter: Payer: Self-pay | Admitting: *Deleted

## 2022-02-19 ENCOUNTER — Encounter: Payer: 59 | Attending: Nurse Practitioner | Admitting: *Deleted

## 2022-02-19 VITALS — Ht 67.0 in | Wt 278.8 lb

## 2022-02-19 DIAGNOSIS — Z713 Dietary counseling and surveillance: Secondary | ICD-10-CM | POA: Diagnosis not present

## 2022-02-19 DIAGNOSIS — E119 Type 2 diabetes mellitus without complications: Secondary | ICD-10-CM | POA: Insufficient documentation

## 2022-02-19 DIAGNOSIS — E1165 Type 2 diabetes mellitus with hyperglycemia: Secondary | ICD-10-CM

## 2022-02-19 NOTE — Patient Instructions (Signed)
Check blood sugars 2 x day before breakfast and 2 hrs after supper every day ?Bring blood sugar records to MD appointments ? ?Exercise:  Walk as tolerated ? ?Eat 3 meals day,   1-2  snacks a day ?Space meals 4-6 hours apart ?Allow 2-3 hours between meals and snacks ?Avoid sugar sweetened drinks (flavored water) ?Avoid fruit at bedtime and include 1 serving of protein when eating fruit for a snack ? ?Call back if you want to schedule classes or an appointment with the dietitian ?

## 2022-02-19 NOTE — Progress Notes (Signed)
Diabetes Self-Management Education ? ?Visit Type: First/Initial ? ?Appt. Start Time: 1320 Appt. End Time: 1415 ? ?02/19/2022 ? ?Denise Velez, identified by name and date of birth, is a 56 y.o. female with a diagnosis of Diabetes: Type 2.  ? ?ASSESSMENT ? ?Blood pressure (P) 138/80, height 5\' 7"  (1.702 m), weight 278 lb 12.8 oz (126.5 kg). ?Body mass index is 43.67 kg/m?. ? ? Diabetes Self-Management Education - 02/19/22 1422   ? ?  ? Visit Information  ? Visit Type First/Initial   ?  ? Initial Visit  ? Diabetes Type Type 2   ? Are you currently following a meal plan? No   ? Are you taking your medications as prescribed? Yes   ? Date Diagnosed 01/22/2022   ?  ? Health Coping  ? How would you rate your overall health? Good   ?  ? Psychosocial Assessment  ? Patient Belief/Attitude about Diabetes Other (comment)   "overwhelmed"  ? Self-care barriers Lack of transportation   ? Self-management support Doctor's office;Friends;Family   ? Patient Concerns Nutrition/Meal planning;Glycemic Control;Weight Control;Medication;Healthy Lifestyle   ? Special Needs None   ? Preferred Learning Style Auditory;Visual;Hands on   ? Learning Readiness Ready   ? How often do you need to have someone help you when you read instructions, pamphlets, or other written materials from your doctor or pharmacy? 1 - Never   ? What is the last grade level you completed in school? college   ?  ? Pre-Education Assessment  ? Patient understands the diabetes disease and treatment process. Needs Instruction   ? Patient understands incorporating nutritional management into lifestyle. Needs Instruction   ? Patient undertands incorporating physical activity into lifestyle. Needs Instruction   ? Patient understands monitoring blood glucose, interpreting and using results Needs Review   ? Patient understands prevention, detection, and treatment of acute complications. Needs Instruction   ? Patient understands prevention, detection, and treatment of chronic  complications. Needs Instruction   ? Patient understands how to develop strategies to address psychosocial issues. Needs Instruction   ? Patient understands how to develop strategies to promote health/change behavior. Needs Instruction   ?  ? Complications  ? Last HgB A1C per patient/outside source 10.5 %   01/15/2022  ? How often do you check your blood sugar? 1-2 times/day   ? Fasting Blood glucose range (mg/dL) 03/17/2022   Pt reports FBG's 174-191 mg/dL  ? Postprandial Blood glucose range (mg/dL) 400-867;619-509   Pt reports pp's 101-187 mg/dL  ? Have you had a dilated eye exam in the past 12 months? No   appt next week  ? Have you had a dental exam in the past 12 months? No   ? Are you checking your feet? Yes   ? How many days per week are you checking your feet? 7   ?  ? Dietary Intake  ? Breakfast oatmeal or applesauce; boiled egg   ? Snack (morning) carrots, fruit (orange, apple) yogurt   ? Lunch 32-671;245-809;983-382 sandwich, tomato sandwich   ? Dinner grilled chicken, hamburger, trukey tenderloin; potaotes, green beans, corn, zucchini, squash; salads with lettuce tomatoes cuccumbers onions, cheese   ? Beverage(s) water, coconut water, ICE   ?  ? Exercise  ? Exercise Type ADL's   ?  ? Patient Education  ? Previous Diabetes Education No   ? Disease state  Definition of diabetes, type 1 and 2, and the diagnosis of diabetes;Factors that contribute to the development of diabetes   ?  Nutrition management  Role of diet in the treatment of diabetes and the relationship between the three main macronutrients and blood glucose level;Food label reading, portion sizes and measuring food.;Reviewed blood glucose goals for pre and post meals and how to evaluate the patients' food intake on their blood glucose level.;Meal timing in regards to the patients' current diabetes medication.   ? Physical activity and exercise  Role of exercise on diabetes management, blood pressure control and cardiac health.   ? Medications  Reviewed patients medication for diabetes, action, purpose, timing of dose and side effects.   ? Monitoring Purpose and frequency of SMBG.;Taught/discussed recording of test results and interpretation of SMBG.;Identified appropriate SMBG and/or A1C goals.   ? Chronic complications Relationship between chronic complications and blood glucose control   ? Psychosocial adjustment Identified and addressed patients feelings and concerns about diabetes   ?  ? Individualized Goals (developed by patient)  ? Reducing Risk Other (comment)   improve blood sugars, decrease medications, lose weight, lead a healthier lifestyle, become more fit  ?  ? Outcomes  ? Expected Outcomes Demonstrated interest in learning. Expect positive outcomes   ? Program Status Not Completed   ? ?  ?  ?Individualized Plan for Diabetes Self-Management Training:  ? ?Learning Objective:  Patient will have a greater understanding of diabetes self-management. ?Patient education plan is to attend individual and/or group sessions per assessed needs and concerns. ?  ?Plan:  ? ?Patient Instructions  ?Check blood sugars 2 x day before breakfast and 2 hrs after supper every day ?Bring blood sugar records to MD appointments ? ?Exercise:  Walk as tolerated ? ?Eat 3 meals day,   1-2  snacks a day ?Space meals 4-6 hours apart ?Allow 2-3 hours between meals and snacks ?Avoid sugar sweetened drinks (flavored water) ?Avoid fruit at bedtime and include 1 serving of protein when eating fruit for a snack ? ?Call back if you want to schedule classes or an appointment with the dietitian ? ?Expected Outcomes:  Demonstrated interest in learning. Expect positive outcomes ? ?Education material provided:  ?Advertising account executive Guidelines ?Simple Meal Plan ?Healthy Snack Choices (ADA) ? ?If problems or questions, patient to contact team via:   ?Sharion Settler, RN, CCM, CDCES [phone] ? ?Future DSME appointment: ?The patient reports that she has transportation issues and is  not able to return for Diabetes classes or an appointment with the dietitian.  ?

## 2022-02-24 LAB — HM DIABETES EYE EXAM

## 2022-02-26 ENCOUNTER — Other Ambulatory Visit: Payer: Self-pay

## 2022-03-05 ENCOUNTER — Ambulatory Visit: Payer: 59 | Admitting: *Deleted

## 2022-03-10 ENCOUNTER — Other Ambulatory Visit: Payer: Self-pay | Admitting: Nurse Practitioner

## 2022-03-13 ENCOUNTER — Encounter: Payer: 59 | Admitting: Obstetrics

## 2022-03-13 ENCOUNTER — Ambulatory Visit: Payer: 59 | Admitting: Nurse Practitioner

## 2022-03-26 ENCOUNTER — Encounter: Payer: Self-pay | Admitting: Nurse Practitioner

## 2022-03-26 ENCOUNTER — Ambulatory Visit (INDEPENDENT_AMBULATORY_CARE_PROVIDER_SITE_OTHER): Payer: 59 | Admitting: Nurse Practitioner

## 2022-03-26 VITALS — BP 157/81 | HR 84 | Ht 67.0 in | Wt 276.3 lb

## 2022-03-26 DIAGNOSIS — Z6841 Body Mass Index (BMI) 40.0 and over, adult: Secondary | ICD-10-CM | POA: Diagnosis not present

## 2022-03-26 DIAGNOSIS — I10 Essential (primary) hypertension: Secondary | ICD-10-CM | POA: Diagnosis not present

## 2022-03-26 DIAGNOSIS — E119 Type 2 diabetes mellitus without complications: Secondary | ICD-10-CM

## 2022-03-26 LAB — GLUCOSE, POCT (MANUAL RESULT ENTRY): POC Glucose: 107 mg/dl — AB (ref 70–99)

## 2022-03-26 MED ORDER — OZEMPIC (0.25 OR 0.5 MG/DOSE) 2 MG/1.5ML ~~LOC~~ SOPN: 0.5000 mg | PEN_INJECTOR | SUBCUTANEOUS | 0 refills | Status: DC

## 2022-03-26 MED ORDER — AMLODIPINE BESYLATE 10 MG PO TABS
10.0000 mg | ORAL_TABLET | Freq: Every day | ORAL | 2 refills | Status: DC
  Filled 2022-10-01: qty 90, 90d supply, fill #0
  Filled 2022-12-27: qty 58, 58d supply, fill #1

## 2022-03-26 NOTE — Assessment & Plan Note (Signed)
BMI 43.27 ?Advised pt to lose weight. ?Advised patient to avoid trans fat, fatty and fried food. ?Follow a regular physical activity schedule. ?  ? ? ? ?

## 2022-03-26 NOTE — Assessment & Plan Note (Signed)
Patient Denise Velez in the office today. ?Advised pt to check the Denise regularly, make a log and bring to next appointment.  ?Advised pt to monitor diet. ?Advised pt to eat variety of food including fruits, vegetables, whole grains, complex carbohydrates and proteins.  ?Continue metformin twice a day, farxiga 5 mg, ozempic 0.5 mg once a day. ?

## 2022-03-26 NOTE — Progress Notes (Signed)
? ?Established Patient Office Visit ? ?Subjective:  ?Patient ID: Denise Velez, female    DOB: May 06, 1966  Age: 56 y.o. MRN: [id number] ? ?CC:  ?Chief Complaint  ?Patient presents with  ? Diabetes  ? ? ? ?HPI ? ?Denise Velez presents for follow up on diabetes and hypertension.  ? ?Diabetes: Patient had attended diabetes nutrition education classes. Her BS at home ranges from 110-130.  Her HgA1c was 10.5 on 01/15/22. She is taking metformin 500 mg twice a day, Farxiga 38m and ozempic 0.25 mg once a week injection since 16 th April,2023. ? ?HTN: Her blood pressure at home fluctuates the lowest reading systolic 1102and highest 1725She is taking losartan 100 mg and amlodipine 5 mg daily.   ? ? ? ?HPI  ? ?Past Medical History:  ?Diagnosis Date  ? Arthritis   ? Diabetes mellitus without complication ([facility]   ? Hypertension   ? Patient denies medical problems   ? ? ?Past Surgical History:  ?Procedure Laterality Date  ? CESAREAN SECTION    ? times 2  ? KNEE ARTHROSCOPY WITH MEDIAL MENISECTOMY Right 05/08/2021  ? Procedure: RIGHT KNEE ARTHROSCOPY WITH MEDIAL MENISECTOMY;  Surgeon: KThornton Park MD;  Location: [facility] ORS;  Service: Orthopedics;  Laterality: Right;  ? ? ?Family History  ?Problem Relation Age of Onset  ? Stroke Mother   ? Kidney disease Mother   ? Hypertension Mother   ? Diabetes Mother   ? Hypertension Brother   ? Diabetes Brother   ? Diabetes Daughter   ? ? ?Social History  ? ?Socioeconomic History  ? Marital status: Divorced  ?  Spouse name: Not on file  ? Number of children: 3  ? Years of education: Not on file  ? Highest education level: Not on file  ?Occupational History  ? Not on file  ?Tobacco Use  ? Smoking status: Former  ?  Packs/day: 0.50  ?  Types: Cigarettes  ?  Quit date: 01/19/2022  ?  Years since quitting: 0.1  ? Smokeless tobacco: Never  ?Vaping Use  ? Vaping Use: Never used  ?Substance and Sexual Activity  ? Alcohol use: Not Currently  ? Drug use: Yes  ?  Types: Marijuana  ?  Comment: occ  ?  Sexual activity: Yes  ?  Birth control/protection: None  ?Other Topics Concern  ? Not on file  ?Social History Narrative  ? Not on file  ? ?Social Determinants of Health  ? ?Financial Resource Strain: Not on file  ?Food Insecurity: Not on file  ?Transportation Needs: Not on file  ?Physical Activity: Not on file  ?Stress: Not on file  ?Social Connections: Not on file  ?Intimate Partner Violence: Not on file  ? ? ? ?Outpatient Medications Prior to Visit  ?Medication Sig Dispense Refill  ? blood glucose meter kit and supplies KIT Dispense based on patient and insurance preference. Check blood sugar twice a day 1 each 0  ? Blood Pressure Monitoring (BLOOD PRESSURE CUFF) MISC 1 each by Does not apply route as needed. (Patient not taking: Reported on 02/19/2022) 1 each 0  ? CONTOUR NEXT TEST test strip USE TO CHECK BLOOD SUGAR TWICE DAILY 100 strip 6  ? dapagliflozin propanediol (FARXIGA) 5 MG TABS tablet Take 1 tablet (5 mg total) by mouth daily before breakfast. 30 tablet 2  ? gabapentin (NEURONTIN) 300 MG capsule Take 300 mg by mouth 2 (two) times daily. (Patient not taking: Reported on 02/19/2022)    ? losartan (  COZAAR) 100 MG tablet Take 1 tablet (100 mg total) by mouth daily. 90 tablet 2  ? metFORMIN (GLUCOPHAGE) 500 MG tablet Take 1 tablet (500 mg total) by mouth 2 (two) times daily with a meal. 180 tablet 3  ? Microlet Lancets MISC USE TO CHECK SUGAR TWICE DAILY 100 each 3  ? rosuvastatin (CRESTOR) 5 MG tablet Take 1 tablet (5 mg total) by mouth daily. 90 tablet 3  ? traMADol (ULTRAM) 50 MG tablet Take 50 mg by mouth 2 (two) times daily. (Patient not taking: Reported on 02/19/2022)    ? amLODipine (NORVASC) 10 MG tablet Take 0.5 tablets (5 mg total) by mouth daily. 90 tablet 2  ? ?No facility-administered medications prior to visit.  ? ? ?Allergies  ?Allergen Reactions  ? Sulfa Antibiotics Rash  ? ? ?ROS ?Review of Systems  ?Constitutional:  Negative for activity change and diaphoresis.  ?HENT:  Negative for congestion,  facial swelling, postnasal drip and sore throat.   ?Eyes:  Negative for pain and visual disturbance.  ?Respiratory:  Negative for chest tightness, shortness of breath and wheezing.   ?Cardiovascular:  Negative for chest pain and palpitations.  ?Gastrointestinal:  Negative for abdominal distention and constipation.  ?Genitourinary:  Negative for dysuria and hematuria.  ?Musculoskeletal:  Positive for back pain and gait problem.  ?Skin:  Negative for color change, pallor and rash.  ?Neurological:  Positive for numbness. Negative for dizziness and headaches.  ?Psychiatric/Behavioral:  Negative for agitation, behavioral problems, confusion and decreased concentration.   ? ?  ?Objective:  ?  ?Physical Exam ?Constitutional:   ?   Appearance: Normal appearance. She is obese.  ?HENT:  ?   Head: Normocephalic and atraumatic.  ?   Right Ear: Tympanic membrane normal.  ?   Left Ear: Tympanic membrane normal.  ?   Nose: Nose normal.  ?   Mouth/Throat:  ?   Mouth: Mucous membranes are moist.  ?   Pharynx: Oropharynx is clear.  ?Eyes:  ?   Extraocular Movements: Extraocular movements intact.  ?   Conjunctiva/sclera: Conjunctivae normal.  ?   Pupils: Pupils are equal, round, and reactive to light.  ?Cardiovascular:  ?   Rate and Rhythm: Normal rate and regular rhythm.  ?   Pulses: Normal pulses.  ?   Heart sounds: Normal heart sounds.  ?Pulmonary:  ?   Effort: Pulmonary effort is normal.  ?   Breath sounds: Normal breath sounds.  ?Abdominal:  ?   General: Bowel sounds are normal.  ?   Palpations: Abdomen is soft.  ?Musculoskeletal:     ?   General: Normal range of motion.  ?   Cervical back: Normal range of motion.  ?Skin: ?   General: Skin is warm.  ?   Capillary Refill: Capillary refill takes less than 2 seconds.  ?Neurological:  ?   General: No focal deficit present.  ?   Mental Status: She is alert and oriented to person, place, and time. Mental status is at baseline.  ?Psychiatric:     ?   Mood and Affect: Mood normal.     ?    Behavior: Behavior normal.     ?   Thought Content: Thought content normal.     ?   Judgment: Judgment normal.  ? ? ?BP (!) 157/81   Pulse 84   Ht _0  (1.702 m)   Wt 276 lb 4.8 oz (125.3 kg)   BMI 43.27 kg/m?  ?Wt Readings from Last  3 Encounters:  ?03/26/22 276 lb 4.8 oz (125.3 kg)  ?02/19/22 278 lb 12.8 oz (126.5 kg)  ?02/13/22 278 lb 1.6 oz (126.1 kg)  ? ? ? ?Health Maintenance Due  ?Topic Date Due  ? COVID-19 Vaccine (1) Never done  ? FOOT EXAM  Never done  ? OPHTHALMOLOGY EXAM  Never done  ? HIV Screening  Never done  ? Hepatitis C Screening  Never done  ? TETANUS/TDAP  Never done  ? PAP SMEAR-Modifier  Never done  ? Zoster Vaccines- Shingrix (1 of 2) Never done  ? ? ?There are no preventive care reminders to display for this patient. ? ?No results found for: TSH ?Lab Results  ?Component Value Date  ? WBC 8.3 01/15/2022  ? HGB 13.8 01/15/2022  ? HCT 41.1 01/15/2022  ? MCV 89.0 01/15/2022  ? PLT 240 01/15/2022  ? ?Lab Results  ?Component Value Date  ? NA 137 01/15/2022  ? K 4.1 01/15/2022  ? CO2 26 01/15/2022  ? GLUCOSE 262 (H) 01/15/2022  ? BUN 11 01/15/2022  ? CREATININE 0.74 01/15/2022  ? BILITOT 0.5 01/15/2022  ? AST 34 01/15/2022  ? ALT 28 01/15/2022  ? PROT 7.1 01/15/2022  ? CALCIUM 9.7 01/15/2022  ? ANIONGAP 8 05/06/2021  ? ?Lab Results  ?Component Value Date  ? CHOL 343 (H) 01/15/2022  ? ?Lab Results  ?Component Value Date  ? HDL 64 01/15/2022  ? ?Lab Results  ?Component Value Date  ? LDLCALC 253 (H) 01/15/2022  ? ?Lab Results  ?Component Value Date  ? TRIG 115 01/15/2022  ? ?Lab Results  ?Component Value Date  ? CHOLHDL 5.4 (H) 01/15/2022  ? ?Lab Results  ?Component Value Date  ? HGBA1C 10.5 (H) 01/15/2022  ? ? ?  ?Assessment & Plan:  ? ?Problem List Items Addressed This Visit   ? ?  ? Cardiovascular and Mediastinum  ? Primary hypertension  ?  BP 157/81 in the office today. ?Advised patient to check BP at home and bring the readings to the office. ?Increased amlodipine 5 mg  to 10 mg once a  day. ?Continue losartan 100 mg once a day.  ? ?  ?  ? Relevant Medications  ? amLODipine (NORVASC) 10 MG tablet  ?  ? Endocrine  ? Type 2 diabetes mellitus without complication, without long-term current use of

## 2022-03-26 NOTE — Assessment & Plan Note (Addendum)
BP 157/81 in the office today. ?Advised patient to check BP at home and bring the readings to the office. ?Increased amlodipine 5 mg  to 10 mg once a day. ?Continue losartan 100 mg once a day.  ?

## 2022-03-30 ENCOUNTER — Encounter: Payer: Self-pay | Admitting: *Deleted

## 2022-04-23 ENCOUNTER — Other Ambulatory Visit: Payer: Self-pay | Admitting: *Deleted

## 2022-04-23 MED ORDER — SEMAGLUTIDE (1 MG/DOSE) 4 MG/3ML ~~LOC~~ SOPN: 1.0000 mg | PEN_INJECTOR | SUBCUTANEOUS | 0 refills | Status: DC

## 2022-04-29 ENCOUNTER — Other Ambulatory Visit: Payer: Self-pay

## 2022-04-29 MED ORDER — FLUCONAZOLE 150 MG PO TABS: 150.0000 mg | ORAL_TABLET | Freq: Once | ORAL | 0 refills | Status: DC

## 2022-05-14 ENCOUNTER — Other Ambulatory Visit: Payer: Self-pay | Admitting: Nurse Practitioner

## 2022-05-14 DIAGNOSIS — E119 Type 2 diabetes mellitus without complications: Secondary | ICD-10-CM

## 2022-05-19 ENCOUNTER — Other Ambulatory Visit: Payer: Self-pay | Admitting: Nurse Practitioner

## 2022-05-22 ENCOUNTER — Encounter: Payer: 59 | Admitting: Obstetrics

## 2022-05-26 ENCOUNTER — Other Ambulatory Visit: Payer: Self-pay | Admitting: Physician Assistant

## 2022-05-26 ENCOUNTER — Encounter: Payer: Self-pay | Admitting: Physician Assistant

## 2022-05-26 ENCOUNTER — Other Ambulatory Visit: Payer: Self-pay

## 2022-05-26 ENCOUNTER — Ambulatory Visit (INDEPENDENT_AMBULATORY_CARE_PROVIDER_SITE_OTHER): Payer: 59 | Admitting: Physician Assistant

## 2022-05-26 VITALS — BP 152/67 | HR 69 | Ht 67.5 in | Wt 262.3 lb

## 2022-05-26 DIAGNOSIS — Z6841 Body Mass Index (BMI) 40.0 and over, adult: Secondary | ICD-10-CM

## 2022-05-26 DIAGNOSIS — Z23 Encounter for immunization: Secondary | ICD-10-CM

## 2022-05-26 DIAGNOSIS — E785 Hyperlipidemia, unspecified: Secondary | ICD-10-CM

## 2022-05-26 DIAGNOSIS — E1169 Type 2 diabetes mellitus with other specified complication: Secondary | ICD-10-CM

## 2022-05-26 DIAGNOSIS — Z1211 Encounter for screening for malignant neoplasm of colon: Secondary | ICD-10-CM

## 2022-05-26 DIAGNOSIS — I152 Hypertension secondary to endocrine disorders: Secondary | ICD-10-CM

## 2022-05-26 DIAGNOSIS — E1159 Type 2 diabetes mellitus with other circulatory complications: Secondary | ICD-10-CM | POA: Diagnosis not present

## 2022-05-26 DIAGNOSIS — E119 Type 2 diabetes mellitus without complications: Secondary | ICD-10-CM | POA: Diagnosis not present

## 2022-05-26 DIAGNOSIS — Z1159 Encounter for screening for other viral diseases: Secondary | ICD-10-CM

## 2022-05-26 DIAGNOSIS — Z1231 Encounter for screening mammogram for malignant neoplasm of breast: Secondary | ICD-10-CM

## 2022-05-26 DIAGNOSIS — R300 Dysuria: Secondary | ICD-10-CM

## 2022-05-26 DIAGNOSIS — N898 Other specified noninflammatory disorders of vagina: Secondary | ICD-10-CM

## 2022-05-26 MED ORDER — ESTRADIOL 0.1 MG/GM VA CREA: 0.5000 g | TOPICAL_CREAM | Freq: Every day | VAGINAL | 1 refills | Status: DC

## 2022-05-26 NOTE — Assessment & Plan Note (Signed)
Currently managed with rosuvastatin 5 mg Reviewed last lipid panel Likely will need to increase dose Check fasting lipids today

## 2022-05-26 NOTE — Assessment & Plan Note (Signed)
Unable to do UA 2/2 azo use

## 2022-05-26 NOTE — Assessment & Plan Note (Addendum)
BMI 03/26/22 was 43.27  Today 40.48 On ozempic 1 mg

## 2022-05-26 NOTE — Assessment & Plan Note (Signed)
May be 2/2 farxiga.  Urine culture Discussed potentially starting estrace, pt is not 1 year w/o cycle yet-- advised she discuss w/ gyn

## 2022-05-26 NOTE — Assessment & Plan Note (Addendum)
Current regimen in Metformin 500 mg bid, farxiga 5 mg and ozempic1 mg  Will check a1c before making changes, but if pt having issues w/ vaginal irritation and a1c below range, consider d/c farxiga. On statin   Foot exam next visit u[facility]r today F/u 3 mo

## 2022-05-26 NOTE — Assessment & Plan Note (Addendum)
Managed with amlodipine 10 mg and losartan 100 mg Elevated in office today. Advised pt to check at home.   Ordered cmp  Consider adding hctz, but with continued weight loss may improve F/u 3 mo

## 2022-05-26 NOTE — Progress Notes (Signed)
I,Denise Velez,acting as a Education administrator for [employer], PA-C.,have documented all relevant documentation on the behalf of Denise Kirschner, PA-C,as directed by  Denise Kirschner, PA-C while in the presence of Denise Kirschner, PA-C.  New patient visit   Patient: Denise Velez   DOB: January 12, 1966   56 y.o. Female  MRN: [id number] Visit Date: 05/26/2022  Today's healthcare provider: Mikey Kirschner, PA-C   Cc. New patient, dm, htn, hld  Subjective    Denise Velez is a 56 y.o. female who presents today as a new patient to establish care.  HPI  Reports never having a colonoscopy or mammogram.  Diabetes -Checks sugars twice daily, fasting usually < 125. Sometimes low in the evening, < 100.   Chronic pain back/ right knee pain, weakness -Post injury at work. Waiting for her insurance to change so she can have treatment at [facility].   Cigarette cessation 4 mo ago, smoked for 5-6 years.  Denies ETOH use.  Reports feeling vaginal/urethral irritation, may have an UTI. Taking Azo. Reports this often happens since she started treatment for diabetes. Reports a decrease in libido, higher levels of anxiety/depression.   Denies ever taking medication for anxiety/depression.   Past Medical History:  Diagnosis Date   Anxiety 03/2022   Arthritis    Depression 03/2022   Been feeling down lately due to mobility and not being able to do the things i use to do.   Diabetes mellitus without complication ([place])    Hypertension    Osteoporosis    Past Surgical History:  Procedure Laterality Date   CESAREAN SECTION     times 2   KNEE ARTHROSCOPY WITH MEDIAL MENISECTOMY Right 05/08/2021   Procedure: RIGHT KNEE ARTHROSCOPY WITH MEDIAL MENISECTOMY;  Surgeon: Denise Park, MD;  Location: [facility] ORS;  Service: Orthopedics;  Laterality: Right;   TUBAL LIGATION  04/1993   Family Status  Relation Name Status   Mother Denise Velez Deceased   Brother Denise Velez Alive   Daughter Denise Velez Alive    Family History  Problem Relation Age of Onset   Stroke Mother    Kidney disease Mother    Hypertension Mother    Diabetes Mother    Arthritis Mother    Hypertension Brother    Diabetes Brother    Diabetes Daughter    Hypertension Daughter    Obesity Daughter    Social History   Socioeconomic History   Marital status: Divorced    Spouse name: Not on file   Number of children: 3   Years of education: Not on file   Highest education level: Not on file  Occupational History   Not on file  Tobacco Use   Smoking status: Former    Packs/day: 0.50    Years: 5.00    Total pack years: 2.50    Types: Cigarettes    Quit date: 01/19/2022    Years since quitting: 0.3   Smokeless tobacco: Never  Vaping Use   Vaping Use: Never used  Substance and Sexual Activity   Alcohol use: Not Currently   Drug use: Not Currently    Types: Marijuana    Comment: occ   Sexual activity: Yes    Birth control/protection: None  Other Topics Concern   Not on file  Social History Narrative   Not on file   Social Determinants of Health   Financial Resource Strain: Not on file  Food Insecurity: Not on file  Transportation Needs: Not on file  Physical  Activity: Not on file  Stress: Not on file  Social Connections: Not on file   Outpatient Medications Prior to Visit  Medication Sig   amLODipine (NORVASC) 10 MG tablet Take 1 tablet (10 mg total) by mouth daily.   blood glucose meter kit and supplies KIT Dispense based on patient and insurance preference. Check blood sugar twice a day   CONTOUR NEXT TEST test strip USE TO CHECK BLOOD SUGAR TWICE DAILY   FARXIGA 5 MG TABS tablet TAKE 1 TABLET BY MOUTH DAILY BEFORE BREAKFAST   gabapentin (NEURONTIN) 300 MG capsule Take 300 mg by mouth 2 (two) times daily.   losartan (COZAAR) 100 MG tablet Take 1 tablet (100 mg total) by mouth daily.   metFORMIN (GLUCOPHAGE) 500 MG tablet Take 1 tablet (500 mg total) by mouth 2 (two) times daily with a meal.    Microlet Lancets MISC USE TO CHECK SUGAR TWICE DAILY   OZEMPIC, 1 MG/DOSE, 4 MG/3ML SOPN INJECT 1MG UNDER THE SKIN ONCE A WEEK   rosuvastatin (CRESTOR) 5 MG tablet Take 1 tablet (5 mg total) by mouth daily.   [DISCONTINUED] fluconazole (DIFLUCAN) 150 MG tablet Take 150 mg by mouth once.   Blood Pressure Monitoring (BLOOD PRESSURE CUFF) MISC 1 each by Does not apply route as needed.   [DISCONTINUED] traMADol (ULTRAM) 50 MG tablet Take 50 mg by mouth 2 (two) times daily. (Patient not taking: Reported on 02/19/2022)   No facility-administered medications prior to visit.   Allergies  Allergen Reactions   Sulfa Antibiotics Rash    Immunization History  Administered Date(s) Administered   Moderna Sars-Covid-2 Vaccination 12/22/2019, 01/19/2020   PPD Test 04/05/2020   Td 05/26/2022   Zoster Recombinat (Shingrix) 05/26/2022    Health Maintenance  Topic Date Due   FOOT EXAM  Never done   Hepatitis C Screening  Never done   Fecal DNA (Cologuard)  Never done   COVID-19 Vaccine (3 - Moderna series) 03/15/2020   PAP SMEAR-Modifier  07/17/2022 (Originally 05/26/1989)   MAMMOGRAM  01/16/2023 (Originally 05/26/2018)   HIV Screening  05/27/2023 (Originally 05/27/1983)   INFLUENZA VACCINE  06/16/2022   HEMOGLOBIN A1C  07/18/2022   Zoster Vaccines- Shingrix (2 of 2) 07/21/2022   OPHTHALMOLOGY EXAM  02/25/2023   TETANUS/TDAP  05/26/2032   HPV VACCINES  Aged Out    Patient Care Team: Denise Kirschner, PA-C as PCP - General (Physician Assistant)  Review of Systems  Constitutional:  Positive for activity change and appetite change.  Musculoskeletal:  Positive for arthralgias and back pain.  Neurological:  Positive for numbness.     Objective    Blood pressure (!) 152/67, pulse 69, height 5' 7.5" (1.715 m), weight 262 lb 4.8 oz (119 kg), SpO2 100 %.   Physical Exam Constitutional:      General: She is awake.     Appearance: She is well-developed.  HENT:     Head: Normocephalic.  Eyes:      Conjunctiva/sclera: Conjunctivae normal.  Cardiovascular:     Rate and Rhythm: Normal rate and regular rhythm.     Heart sounds: Normal heart sounds.  Pulmonary:     Effort: Pulmonary effort is normal.     Breath sounds: Normal breath sounds.  Skin:    General: Skin is warm.  Neurological:     Mental Status: She is alert and oriented to person, place, and time.  Psychiatric:        Attention and Perception: Attention normal.  Mood and Affect: Mood normal.        Speech: Speech normal.        Behavior: Behavior is cooperative.    Depression Screen    05/26/2022   11:23 AM 02/19/2022    1:34 PM 01/15/2022   11:36 AM  PHQ 2/9 Scores  PHQ - 2 Score 4 0 1  PHQ- 9 Score 17     No results found for any visits on 05/26/22.  Assessment & Plan      Problem List Items Addressed This Visit       Cardiovascular and Mediastinum   Hypertension associated with diabetes ([place])    Managed with amlodipine 10 mg and losartan 100 mg Elevated in office today. Advised pt to check at home.   Ordered cmp  Consider adding hctz, but with continued weight loss may improve F/u 3 mo       Relevant Orders   Comprehensive Metabolic Panel (CMET)     Endocrine   Type 2 diabetes mellitus without complication, without long-term current use of insulin ([facility]) - Primary    Current regimen in Metformin 500 mg bid, farxiga 5 mg and ozempic1 mg  Will check a1c before making changes, but if pt having issues w/ vaginal irritation and a1c below range, consider d/c farxiga. On statin   Foot exam next visit uacr today F/u 3 mo      Relevant Orders   Hemoglobin A1c   Hyperlipidemia associated with type 2 diabetes mellitus ([place])    Currently managed with rosuvastatin 5 mg Reviewed last lipid panel Likely will need to increase dose Check fasting lipids today       Relevant Orders   Lipid Profile     Genitourinary   Vaginal dryness    May be 2/2 farxiga.  Urine culture Discussed potentially  starting estrace, pt is not 1 year w/o cycle yet-- advised she discuss w/ gyn        Other   Morbid obesity with BMI of 40.0-44.9, adult ([place])    BMI 03/26/22 was 43.27  Today 40.48 On ozempic 1 mg       Dysuria    Unable to do UA 2/2 azo use      Relevant Orders   Urine Culture   Other Visit Diagnoses     Encounter for hepatitis C screening test for low risk patient       Relevant Orders   Hepatitis C Antibody   Need for shingles vaccine       Relevant Orders   Varicella-zoster vaccine IM (Shingrix) (Completed)   Need for vaccine for DT (diphtheria-tetanus)       Relevant Orders   Td : Tetanus/diphtheria >7yo Preservative  free (Completed)   Colon cancer screening       Relevant Orders   Cologuard   Screening mammogram for breast cancer       Relevant Orders   MM 3D SCREEN BREAST BILATERAL        Return in about 3 months (around 08/26/2022) for DMII, hyperlipidemia, hypertension.     I, Denise Kirschner, PA-C have reviewed all documentation for this visit. The documentation on  05/26/2022 for the exam, diagnosis, procedures, and orders are all accurate and complete.  Denise Kirschner, PA-C [facility] [address] [place], [place], [address] Office: [phone] Fax: [place]

## 2022-05-26 NOTE — Patient Instructions (Signed)
[email or web address]  

## 2022-05-27 ENCOUNTER — Telehealth: Payer: Self-pay

## 2022-05-27 ENCOUNTER — Other Ambulatory Visit: Payer: Self-pay | Admitting: Physician Assistant

## 2022-05-27 DIAGNOSIS — E1169 Type 2 diabetes mellitus with other specified complication: Secondary | ICD-10-CM

## 2022-05-27 LAB — COMPREHENSIVE METABOLIC PANEL
ALT: 18 IU/L (ref 0–32)
AST: 14 IU/L (ref 0–40)
Albumin/Globulin Ratio: 1.7 (ref 1.2–2.2)
Albumin: 4.5 g/dL (ref 3.8–4.9)
Alkaline Phosphatase: 122 IU/L — ABNORMAL HIGH (ref 44–121)
BUN/Creatinine Ratio: 9 (ref 9–23)
BUN: 6 mg/dL (ref 6–24)
Bilirubin Total: 0.3 mg/dL (ref 0.0–1.2)
CO2: 25 mmol/L (ref 20–29)
Calcium: 9.7 mg/dL (ref 8.7–10.2)
Chloride: 102 mmol/L (ref 96–106)
Creatinine, Ser: 0.65 mg/dL (ref 0.57–1.00)
Globulin, Total: 2.7 g/dL (ref 1.5–4.5)
Glucose: 89 mg/dL (ref 70–99)
Potassium: 4.5 mmol/L (ref 3.5–5.2)
Sodium: 139 mmol/L (ref 134–144)
Total Protein: 7.2 g/dL (ref 6.0–8.5)
eGFR: 105 mL/min/{1.73_m2} (ref >59)

## 2022-05-27 LAB — HEMOGLOBIN A1C
Est. average glucose Bld gHb Est-mCnc: 123 mg/dL
Hgb A1c MFr Bld: 5.9 % — ABNORMAL HIGH (ref 4.8–5.6)

## 2022-05-27 LAB — LIPID PANEL
Chol/HDL Ratio: 2.9 ratio (ref 0.0–4.4)
Cholesterol, Total: 182 mg/dL (ref 100–199)
HDL: 62 mg/dL (ref >39)
LDL Chol Calc (NIH): 106 mg/dL — ABNORMAL HIGH (ref 0–99)
Triglycerides: 75 mg/dL (ref 0–149)
VLDL Cholesterol Cal: 14 mg/dL (ref 5–40)

## 2022-05-27 LAB — MICROALBUMIN / CREATININE URINE RATIO
Creatinine, Urine: 77.3 mg/dL
Microalb/Creat Ratio: 14 mg/g creat (ref 0–29)
Microalbumin, Urine: 10.7 ug/mL

## 2022-05-27 LAB — HEPATITIS C ANTIBODY: Hep C Virus Ab: NONREACTIVE

## 2022-05-27 MED ORDER — ROSUVASTATIN CALCIUM 10 MG PO TABS
10.0000 mg | ORAL_TABLET | Freq: Every day | ORAL | 3 refills | Status: DC
  Filled 2022-11-03 – 2022-11-17 (×2): qty 90, 90d supply, fill #0
  Filled 2023-02-07: qty 90, 90d supply, fill #1

## 2022-05-27 NOTE — Telephone Encounter (Signed)
Copied from CRM [id number]. Topic: Referral - Request for Referral >> May 27, 2022 11:16 AM Lyman Speller wrote: Has patient seen PCP for this complaint? Yes  *If NO, is insurance requiring patient see PCP for this issue before PCP can refer them? Referral for which specialty: ortho Preferred provider/office: [facility]  Reason for referral: back and right knee pain / clinic needs referral

## 2022-05-27 NOTE — Telephone Encounter (Signed)
Copied from CRM [id number]. Topic: General - Other >> May 27, 2022 11:14 AM Lyman Speller wrote: Reason for CRM: Pt called to let Lindsay's nurse know that her last cycle was 12.23.21 / please advise

## 2022-05-28 ENCOUNTER — Other Ambulatory Visit: Payer: Self-pay | Admitting: Physician Assistant

## 2022-05-28 DIAGNOSIS — M545 Low back pain, unspecified: Secondary | ICD-10-CM

## 2022-05-28 DIAGNOSIS — N3000 Acute cystitis without hematuria: Secondary | ICD-10-CM

## 2022-05-28 DIAGNOSIS — G8929 Other chronic pain: Secondary | ICD-10-CM

## 2022-05-28 LAB — URINE CULTURE

## 2022-05-28 MED ORDER — NITROFURANTOIN MONOHYD MACRO 100 MG PO CAPS: 100.0000 mg | ORAL_CAPSULE | Freq: Two times a day (BID) | ORAL | 0 refills | Status: DC

## 2022-06-02 ENCOUNTER — Encounter: Payer: Self-pay | Admitting: Physician Assistant

## 2022-06-04 ENCOUNTER — Encounter: Payer: Self-pay | Admitting: Physician Assistant

## 2022-06-04 ENCOUNTER — Other Ambulatory Visit: Payer: Self-pay | Admitting: Physician Assistant

## 2022-06-04 DIAGNOSIS — E119 Type 2 diabetes mellitus without complications: Secondary | ICD-10-CM

## 2022-06-04 MED ORDER — DEXCOM G7 RECEIVER DEVI: 0 refills | Status: DC

## 2022-06-04 MED ORDER — DEXCOM G7 SENSOR MISC
3 refills | Status: DC
Start: 2022-06-04 — End: 2022-09-18

## 2022-06-10 ENCOUNTER — Encounter: Payer: Self-pay | Admitting: Physician Assistant

## 2022-06-11 ENCOUNTER — Other Ambulatory Visit: Payer: Self-pay | Admitting: Physician Assistant

## 2022-06-11 DIAGNOSIS — E119 Type 2 diabetes mellitus without complications: Secondary | ICD-10-CM

## 2022-06-11 MED ORDER — OZEMPIC (1 MG/DOSE) 4 MG/3ML ~~LOC~~ SOPN: PEN_INJECTOR | SUBCUTANEOUS | 1 refills | Status: DC

## 2022-06-12 ENCOUNTER — Other Ambulatory Visit: Payer: Self-pay

## 2022-06-12 ENCOUNTER — Telehealth: Payer: Self-pay

## 2022-06-12 ENCOUNTER — Other Ambulatory Visit: Payer: Self-pay | Admitting: Physician Assistant

## 2022-06-12 DIAGNOSIS — R195 Other fecal abnormalities: Secondary | ICD-10-CM

## 2022-06-12 DIAGNOSIS — Z1211 Encounter for screening for malignant neoplasm of colon: Secondary | ICD-10-CM

## 2022-06-12 LAB — COLOGUARD: COLOGUARD: POSITIVE — AB

## 2022-06-12 MED ORDER — NA SULFATE-K SULFATE-MG SULF 17.5-3.13-1.6 GM/177ML PO SOLN: 1.0000 | Freq: Once | ORAL | 0 refills | Status: AC

## 2022-06-12 NOTE — Telephone Encounter (Signed)
Gastroenterology Pre-Procedure Review  Request Date: 07/28/22 Requesting Physician: Dr. Vicente Males  PATIENT REVIEW QUESTIONS: The patient responded to the following health history questions as indicated:    1. Are you having any GI issues? Positive colorectal screening 2. Do you have a personal history of Polyps? no 3. Do you have a family history of Colon Cancer or Polyps? no 4. Diabetes Mellitus? yes (oral and injectable med) 5. Joint replacements in the past 12 months?no 6. Major health problems in the past 3 months?no 7. Any artificial heart valves, MVP, or defibrillator?no    MEDICATIONS & ALLERGIES:    Patient reports the following regarding taking any anticoagulation/antiplatelet therapy:   Plavix, Coumadin, Eliquis, Xarelto, Lovenox, Pradaxa, Brilinta, or Effient? no Aspirin? no  Patient confirms/reports the following medications:  Current Outpatient Medications  Medication Sig Dispense Refill   amLODipine (NORVASC) 10 MG tablet Take 1 tablet (10 mg total) by mouth daily. 90 tablet 2   blood glucose meter kit and supplies KIT Dispense based on patient and insurance preference. Check blood sugar twice a day 1 each 0   Blood Pressure Monitoring (BLOOD PRESSURE CUFF) MISC 1 each by Does not apply route as needed. 1 each 0   Continuous Blood Gluc Receiver (DEXCOM G7 RECEIVER) DEVI Use with dexcom g7 sensors 1 each 0   Continuous Blood Gluc Sensor (DEXCOM G7 SENSOR) MISC Replace every 10 days for continuous glucose monitoring 3 each 3   CONTOUR NEXT TEST test strip USE TO CHECK BLOOD SUGAR TWICE DAILY 100 strip 6   FARXIGA 5 MG TABS tablet TAKE 1 TABLET BY MOUTH DAILY BEFORE BREAKFAST 30 tablet 2   gabapentin (NEURONTIN) 300 MG capsule Take 300 mg by mouth 2 (two) times daily.     losartan (COZAAR) 100 MG tablet Take 1 tablet (100 mg total) by mouth daily. 90 tablet 2   metFORMIN (GLUCOPHAGE) 500 MG tablet Take 1 tablet (500 mg total) by mouth 2 (two) times daily with a meal. 180 tablet  3   Microlet Lancets MISC USE TO CHECK SUGAR TWICE DAILY 100 each 3   nitrofurantoin, macrocrystal-monohydrate, (MACROBID) 100 MG capsule Take 1 capsule (100 mg total) by mouth 2 (two) times daily. 10 capsule 0   rosuvastatin (CRESTOR) 10 MG tablet Take 1 tablet (10 mg total) by mouth daily. 90 tablet 3   Semaglutide, 1 MG/DOSE, (OZEMPIC, 1 MG/DOSE,) 4 MG/3ML SOPN INJECT 1MG UNDER THE SKIN ONCE A WEEK 3 mL 1   No current facility-administered medications for this visit.    Patient confirms/reports the following allergies:  Allergies  Allergen Reactions   Sulfa Antibiotics Rash    No orders of the defined types were placed in this encounter.   AUTHORIZATION INFORMATION Primary Insurance: 1D#: Group #:  Secondary Insurance: 1D#: Group #:  SCHEDULE INFORMATION: Date: 07/28/22 Time: Location: [facility]

## 2022-06-27 ENCOUNTER — Encounter: Payer: Self-pay | Admitting: Physician Assistant

## 2022-07-12 ENCOUNTER — Encounter: Payer: Self-pay | Admitting: Physician Assistant

## 2022-07-13 ENCOUNTER — Other Ambulatory Visit: Payer: Self-pay | Admitting: Nurse Practitioner

## 2022-07-14 ENCOUNTER — Other Ambulatory Visit: Payer: Self-pay

## 2022-07-14 DIAGNOSIS — E119 Type 2 diabetes mellitus without complications: Secondary | ICD-10-CM

## 2022-07-15 ENCOUNTER — Encounter: Payer: Self-pay | Admitting: Physician Assistant

## 2022-07-15 ENCOUNTER — Other Ambulatory Visit: Payer: Self-pay | Admitting: Family Medicine

## 2022-07-15 MED ORDER — SEMAGLUTIDE (2 MG/DOSE) 8 MG/3ML ~~LOC~~ SOPN: 2.0000 mg | PEN_INJECTOR | SUBCUTANEOUS | 2 refills | Status: DC

## 2022-07-16 ENCOUNTER — Encounter: Payer: Self-pay | Admitting: Obstetrics

## 2022-07-16 ENCOUNTER — Other Ambulatory Visit (HOSPITAL_COMMUNITY)
Admission: RE | Admit: 2022-07-16 | Discharge: 2022-07-16 | Disposition: A | Discharge status: Home / Self Care | Payer: 59 | Source: Ambulatory Visit | Attending: Obstetrics | Admitting: Obstetrics

## 2022-07-16 ENCOUNTER — Ambulatory Visit (INDEPENDENT_AMBULATORY_CARE_PROVIDER_SITE_OTHER): Payer: 59 | Admitting: Obstetrics

## 2022-07-16 VITALS — BP 145/82 | HR 76 | Ht 67.0 in | Wt 255.6 lb

## 2022-07-16 DIAGNOSIS — Z124 Encounter for screening for malignant neoplasm of cervix: Secondary | ICD-10-CM

## 2022-07-16 DIAGNOSIS — Z01419 Encounter for gynecological examination (general) (routine) without abnormal findings: Secondary | ICD-10-CM

## 2022-07-16 DIAGNOSIS — E049 Nontoxic goiter, unspecified: Secondary | ICD-10-CM

## 2022-07-16 DIAGNOSIS — Z1231 Encounter for screening mammogram for malignant neoplasm of breast: Secondary | ICD-10-CM

## 2022-07-16 MED ORDER — CLINDAMYCIN PHOSPHATE 1 % EX SWAB: 1.0000 | Freq: Two times a day (BID) | CUTANEOUS | 0 refills | Status: DC

## 2022-07-16 MED ORDER — ESTRADIOL 0.1 MG/GM VA CREA
1.0000 | TOPICAL_CREAM | Freq: Every day | VAGINAL | 12 refills | Status: DC
  Filled 2022-09-20: qty 42.5, 30d supply, fill #0
  Filled 2022-10-01: qty 42.5, fill #0

## 2022-07-16 MED ORDER — CITALOPRAM HYDROBROMIDE 20 MG PO TABS: 20.0000 mg | ORAL_TABLET | Freq: Every day | ORAL | 2 refills | Status: DC

## 2022-07-16 NOTE — Progress Notes (Signed)
SUBJECTIVE  HPI  Denise Velez is a 56 y.o.-year-old female who presents for an annual gynecological exam and Pap smear today. She denies unusual vaginal bleeding or discharge, pelvic pain, and UTI symptoms. She does report recurrent vulvar boils. Her last period was in December 2022. She has been having hot flashes at night, mood swings, vaginal dryness, and decreased libido. She has been dealing with depression and anxiety since she was injured 2 years ago.     Medical/Surgical History Past Medical History:  Diagnosis Date   Anxiety 03/2022   Arthritis    Depression 03/2022   Been feeling down lately due to mobility and not being able to do the things i use to do.   Diabetes mellitus without complication ([place])    Hypertension    Osteoporosis    Past Surgical History:  Procedure Laterality Date   CESAREAN SECTION     times 2   KNEE ARTHROSCOPY WITH MEDIAL MENISECTOMY Right 05/08/2021   Procedure: RIGHT KNEE ARTHROSCOPY WITH MEDIAL MENISECTOMY;  Surgeon: Thornton Park, MD;  Location: [facility] ORS;  Service: Orthopedics;  Laterality: Right;   TUBAL LIGATION  04/1993    Social History Lives with boyfriend Work: on disability Exercise: just signed up for water aerobics Substances: denies EtOH and vape. Quit tobacco in March. Occasional MJ use for relaxation.  Obstetric History OB History     Gravida  4   Para      Term      Preterm      AB  1   Living         SAB  1   IAB      Ectopic      Multiple      Live Births  3            GYN/Menstrual History No LMP recorded (lmp unknown). (Menstrual status: Other). No period since December Last Pap: many years ago Contraception: abstinence   Prevention Colonoscopy: abnormal Cologuard, getting colonoscopy done next month Mammogram: has never had  Current Medications Outpatient Medications Prior to Visit  Medication Sig   amLODipine (NORVASC) 10 MG tablet Take 1 tablet (10 mg total) by mouth daily.    blood glucose meter kit and supplies KIT Dispense based on patient and insurance preference. Check blood sugar twice a day   Blood Pressure Monitoring (BLOOD PRESSURE CUFF) MISC 1 each by Does not apply route as needed.   Continuous Blood Gluc Receiver (DEXCOM G7 RECEIVER) DEVI Use with dexcom g7 sensors   Continuous Blood Gluc Sensor (DEXCOM G7 SENSOR) MISC Replace every 10 days for continuous glucose monitoring   CONTOUR NEXT TEST test strip USE TO CHECK BLOOD SUGAR TWICE DAILY   FARXIGA 5 MG TABS tablet TAKE 1 TABLET BY MOUTH DAILY BEFORE BREAKFAST   gabapentin (NEURONTIN) 300 MG capsule Take 300 mg by mouth 2 (two) times daily.   losartan (COZAAR) 100 MG tablet Take 1 tablet (100 mg total) by mouth daily.   losartan (COZAAR) 50 MG tablet TAKE 1 TABLET(50 MG) BY MOUTH DAILY   metFORMIN (GLUCOPHAGE) 500 MG tablet Take 1 tablet (500 mg total) by mouth 2 (two) times daily with a meal.   Microlet Lancets MISC USE TO CHECK SUGAR TWICE DAILY   rosuvastatin (CRESTOR) 10 MG tablet Take 1 tablet (10 mg total) by mouth daily.   Semaglutide, 2 MG/DOSE, 8 MG/3ML SOPN Inject 2 mg into the skin once a week.   nitrofurantoin, macrocrystal-monohydrate, (MACROBID) 100 MG capsule Take  1 capsule (100 mg total) by mouth 2 (two) times daily. (Patient not taking: Reported on 07/16/2022)   No facility-administered medications prior to visit.      The pregnancy intention screening data noted above was reviewed. Potential methods of contraception were discussed. The patient elected to proceed with No data recorded.   ROS History obtained from the patient General ROS: positive for  - hot flashes and night sweats negative for - chills or fatigue Psychological ROS: positive for - anxiety, decreased libido, and depression Ophthalmic ROS: negative for - blurry vision or decreased vision ENT ROS: negative for - headaches or sore throat Hematological and Lymphatic ROS: negative for - swollen lymph nodes Endocrine  ROS: negative for - breast changes, palpitations, polydipsia/polyuria, or temperature intolerance Breast ROS: negative for breast lumps Respiratory ROS: no cough, shortness of breath, or wheezing Cardiovascular ROS: no chest pain or dyspnea on exertion Gastrointestinal ROS: no abdominal pain, change in bowel habits, or black or bloody stools Genito-Urinary ROS: no dysuria, trouble voiding, or hematuria Musculoskeletal ROS: Pain from knee surgery Dermatological ROS: Recurrent boils in vulvar area     05/26/2022   11:23 AM 02/19/2022    1:34 PM 01/15/2022   11:36 AM  Depression screen PHQ 2/9  Decreased Interest 2 0 0  Down, Depressed, Hopeless 2 0 1  PHQ - 2 Score 4 0 1  Altered sleeping 2    Tired, decreased energy 1    Change in appetite 2    Feeling bad or failure about yourself  3    Trouble concentrating 1    Moving slowly or fidgety/restless 2    Suicidal thoughts 2    PHQ-9 Score 17    Difficult doing work/chores Very difficult       OBJECTIVE  Last Weight  Most recent update: 07/16/2022  1:27 PM    Weight  115.9 kg (255 lb 9.6 oz)             Body mass index is 40.03 kg/m.    BP (!) 145/82   Pulse 76   Ht '5\' 7"'  (1.702 m)   Wt 255 lb 9.6 oz (115.9 kg)   LMP  (LMP Unknown)   BMI 40.03 kg/m  General appearance: alert, cooperative, and appears stated age Head: Normocephalic, without obvious abnormality, atraumatic Eyes: negative findings: lids and lashes normal and conjunctivae and sclerae normal Neck: no adenopathy, supple, symmetrical, trachea midline, and thyroid soft, mildly enlarged Lungs: clear to auscultation bilaterally Breasts: normal appearance, no masses or tenderness, Inspection negative, No nipple retraction or dimpling, No nipple discharge or bleeding, No axillary or supraclavicular adenopathy, Normal to palpation without dominant masses, dense breast tissue Heart: regular rate and rhythm, S1, S2 normal, no murmur, click, rub or gallop Abdomen:  soft, non-tender; bowel sounds normal; no masses,  no organomegaly Pelvic: cervix normal in appearance, external genitalia normal, no adnexal masses or tenderness, no cervical motion tenderness, rectovaginal septum normal, and vagina normal without discharge. Small pustule noted on vulva Extremities: extremities normal, atraumatic, no cyanosis or edema Pulses: 2+ and symmetric Skin: Skin color, texture, turgor normal. No rashes or lesions Lymph nodes: Cervical, supraclavicular, and axillary nodes normal.  ASSESSMENT  1) Annual exam 2) Pap due 3) Vasomotor symptoms of menopause 4) Anxiety/depression 5) Vulvar lesions, possible hidradenitis suppurativa  PLAN 1) Physical exam as noted. Discussed healthy lifestyle choices and preventive care. Agrees to mammogram in the next few months; order placed. STI swab collected. Declines STI blood work (had  done recently). TSH for mildly enlarged thyroid. 2) Pap collected. Will f/u based on results. 3) Celexa 20 mg PO for hot flashes. Discussed vaginal moisturizer for dryness. Strongly desires estrogen cream. Rx sent for Estrace. 4) Declines counseling. Denies SI. Will try Celexa. Discussed proper administration and warning signs. 5) Discussed hygiene, warm compresses, clindamycin topical solution.   Return in one year for annual exam or as needed for concerns.   Lloyd Huger, CNM

## 2022-07-21 ENCOUNTER — Ambulatory Visit: Payer: 59 | Admitting: Physician Assistant

## 2022-07-21 ENCOUNTER — Other Ambulatory Visit: Payer: Self-pay

## 2022-07-21 NOTE — Addendum Note (Signed)
Addended by: Loney Laurence on: 07/21/2022 03:07 PM   Modules accepted: Orders

## 2022-07-22 ENCOUNTER — Telehealth: Payer: Self-pay | Admitting: Gastroenterology

## 2022-07-22 LAB — TSH: TSH: 1.36 u[IU]/mL (ref 0.450–4.500)

## 2022-07-22 NOTE — Telephone Encounter (Signed)
Pt called to cancel procedure for 07/28/2022 will call back and resched

## 2022-07-23 ENCOUNTER — Other Ambulatory Visit: Payer: Self-pay | Admitting: Obstetrics

## 2022-07-23 ENCOUNTER — Encounter: Payer: Self-pay | Admitting: Obstetrics

## 2022-07-23 LAB — CYTOLOGY - PAP
Chlamydia: NEGATIVE
Comment: NEGATIVE
Comment: NEGATIVE
Comment: NEGATIVE
Comment: NORMAL
Diagnosis: NEGATIVE
High risk HPV: NEGATIVE
Neisseria Gonorrhea: NEGATIVE
Trichomonas: POSITIVE — AB

## 2022-07-23 MED ORDER — METRONIDAZOLE 500 MG PO TABS: 500.0000 mg | ORAL_TABLET | Freq: Two times a day (BID) | ORAL | 0 refills | Status: DC

## 2022-07-23 NOTE — Progress Notes (Signed)
+   trichomoniasis. Metronidazole sent to pharmacy on file. [place] notified via MyChart.  M. Chryl Heck, CNM

## 2022-07-23 NOTE — Telephone Encounter (Signed)
Returned patients call to reschedule her colonoscopy.  She stated that she is rescheduling due to expected copay of $900.  I informed her that she would be billed if she did not have all of this to pay upfront.  She said she will call back at a later time to have it rescheduled based on her boyfriends schedule.   Thanks,  [place], [place]

## 2022-07-28 ENCOUNTER — Ambulatory Visit: Admission: RE | Admit: 2022-07-28 | Payer: 59 | Source: Home / Self Care | Admitting: Gastroenterology

## 2022-07-28 ENCOUNTER — Encounter: Admission: RE | Payer: Self-pay | Source: Home / Self Care

## 2022-07-28 SURGERY — COLONOSCOPY WITH PROPOFOL
Anesthesia: General

## 2022-08-03 ENCOUNTER — Encounter: Payer: Self-pay | Admitting: Physician Assistant

## 2022-08-04 ENCOUNTER — Other Ambulatory Visit: Payer: Self-pay | Admitting: Physician Assistant

## 2022-08-04 DIAGNOSIS — E119 Type 2 diabetes mellitus without complications: Secondary | ICD-10-CM

## 2022-08-04 MED ORDER — ACCU-CHEK SOFTCLIX LANCETS MISC
3 refills | Status: DC
  Filled 2022-09-18: qty 100, 25d supply, fill #0
  Filled 2022-10-01: qty 100, 30d supply, fill #0
  Filled 2022-12-07: qty 100, 30d supply, fill #1
  Filled 2023-02-07: qty 100, 30d supply, fill #2

## 2022-08-04 MED ORDER — ACCU-CHEK GUIDE W/DEVICE KIT: PACK | 0 refills | Status: DC

## 2022-08-04 MED ORDER — ACCU-CHEK GUIDE VI STRP
ORAL_STRIP | 4 refills | Status: DC
  Filled 2022-09-18: qty 100, 25d supply, fill #0
  Filled 2022-10-01: qty 100, 30d supply, fill #0
  Filled 2022-12-07: qty 100, 30d supply, fill #1
  Filled 2023-02-07: qty 100, 30d supply, fill #2
  Filled 2023-03-12: qty 100, 30d supply, fill #3

## 2022-08-04 NOTE — Progress Notes (Signed)
Needs new glucose meter

## 2022-08-09 ENCOUNTER — Encounter: Payer: Self-pay | Admitting: Physician Assistant

## 2022-08-10 ENCOUNTER — Other Ambulatory Visit: Payer: Self-pay | Admitting: Physician Assistant

## 2022-08-10 DIAGNOSIS — E119 Type 2 diabetes mellitus without complications: Secondary | ICD-10-CM

## 2022-08-10 MED ORDER — SEMAGLUTIDE (2 MG/DOSE) 8 MG/3ML ~~LOC~~ SOPN: 2.0000 mg | PEN_INJECTOR | SUBCUTANEOUS | 2 refills | Status: DC

## 2022-08-11 ENCOUNTER — Encounter: Payer: Self-pay | Admitting: Physician Assistant

## 2022-08-12 ENCOUNTER — Other Ambulatory Visit: Payer: Self-pay | Admitting: Internal Medicine

## 2022-08-12 DIAGNOSIS — E119 Type 2 diabetes mellitus without complications: Secondary | ICD-10-CM

## 2022-08-17 ENCOUNTER — Encounter: Payer: Self-pay | Admitting: Physician Assistant

## 2022-08-18 ENCOUNTER — Telehealth: Payer: Self-pay | Admitting: Physician Assistant

## 2022-08-18 ENCOUNTER — Telehealth: Payer: Self-pay

## 2022-08-18 NOTE — Telephone Encounter (Signed)
Patient stated [place] Medicaid ID # [id number], patient states there is no group number, insurance went into effect on 07/28/2022.   Any additional information needed please send a my chart message.

## 2022-08-18 NOTE — Telephone Encounter (Signed)
Spoke with patient and patient states she will be sending a picture of her card and sending through [employer]

## 2022-08-18 NOTE — Telephone Encounter (Signed)
Advised patient yes she can receive vaccine at next appointment as long as she is not having any symptoms or concerns and vaccines are available in office. Patient verbalized understanding

## 2022-08-18 NOTE — Telephone Encounter (Signed)
My chart message also sent.

## 2022-08-18 NOTE — Telephone Encounter (Signed)
Please see other MyChart message from 08/11/22

## 2022-08-18 NOTE — Telephone Encounter (Signed)
Patient would like to know if she can receive her second shingle shot on 08/26/2022

## 2022-08-24 NOTE — Telephone Encounter (Signed)
PA started

## 2022-08-25 ENCOUNTER — Encounter: Payer: Self-pay | Admitting: Physician Assistant

## 2022-08-26 ENCOUNTER — Ambulatory Visit: Payer: Medicaid Other | Admitting: Physician Assistant

## 2022-08-26 ENCOUNTER — Encounter: Payer: Self-pay | Admitting: Physician Assistant

## 2022-08-26 VITALS — BP 157/79 | HR 82 | Temp 98.6°F | Resp 16 | Wt 252.0 lb

## 2022-08-26 DIAGNOSIS — E119 Type 2 diabetes mellitus without complications: Secondary | ICD-10-CM

## 2022-08-26 DIAGNOSIS — I152 Hypertension secondary to endocrine disorders: Secondary | ICD-10-CM

## 2022-08-26 DIAGNOSIS — Z23 Encounter for immunization: Secondary | ICD-10-CM | POA: Diagnosis not present

## 2022-08-26 DIAGNOSIS — E1159 Type 2 diabetes mellitus with other circulatory complications: Secondary | ICD-10-CM | POA: Diagnosis not present

## 2022-08-26 LAB — POCT GLYCOSYLATED HEMOGLOBIN (HGB A1C)
Est. average glucose Bld gHb Est-mCnc: 111
Hemoglobin A1C: 5.5 % (ref 4.0–5.6)

## 2022-08-26 MED ORDER — HYDROCHLOROTHIAZIDE 12.5 MG PO CAPS
12.5000 mg | ORAL_CAPSULE | Freq: Every day | ORAL | 1 refills | Status: DC
  Filled 2022-11-03 – 2022-11-17 (×2): qty 90, 90d supply, fill #0

## 2022-08-26 MED ORDER — DAPAGLIFLOZIN PROPANEDIOL 5 MG PO TABS: 5.0000 mg | ORAL_TABLET | Freq: Every day | ORAL | 0 refills | Status: DC

## 2022-08-26 NOTE — Progress Notes (Signed)
I,Roshena L Chambers,acting as a scribe for [employer], PA-C.,have documented all relevant documentation on the behalf of Mikey Kirschner, PA-C,as directed by  Mikey Kirschner, PA-C while in the presence of Mikey Kirschner, PA-C.   Established patient visit   Patient: Denise Velez   DOB: August 27, 1966   56 y.o. Female  MRN: [id number] Visit Date: 08/26/2022  Today's healthcare provider: Mikey Kirschner, PA-C   Chief Complaint  Patient presents with   Diabetes   Subjective    HPI   Diabetes Mellitus Type II, Follow-up  Lab Results  Component Value Date   HGBA1C 5.5 08/26/2022   HGBA1C 5.9 (H) 05/26/2022   HGBA1C 10.5 (H) 01/15/2022   Wt Readings from Last 3 Encounters:  08/26/22 252 lb (114.3 kg)  07/16/22 255 lb 9.6 oz (115.9 kg)  05/26/22 262 lb 4.8 oz (119 kg)   Last seen for diabetes 3 months ago.  Management since then includes continue same medication. She reports good compliance with treatment. She is not having side effects.  Symptoms: No fatigue No foot ulcerations  No appetite changes No nausea  No paresthesia of the feet  No polydipsia  No polyuria No visual disturbances   No vomiting     Home blood sugar records: fasting range: 90's  Episodes of hypoglycemia? Yes occasionally, lowest seen is 65. Pt has emergency glucose tablets   Current insulin regiment: none Most Recent Eye Exam: 02/24/2022 Current exercise: none Current diet habits: in general, a "healthy" diet    Pertinent Labs: Lab Results  Component Value Date   CHOL 182 05/26/2022   HDL 62 05/26/2022   LDLCALC 106 (H) 05/26/2022   TRIG 75 05/26/2022   CHOLHDL 2.9 05/26/2022   Lab Results  Component Value Date   NA 139 05/26/2022   K 4.5 05/26/2022   CREATININE 0.65 05/26/2022   EGFR 105 05/26/2022   LABMICR 10.7 05/26/2022     ---------------------------------------------------------------------------------------------------     ---------------------------------------------------------------------------------------------------   Medications: Outpatient Medications Prior to Visit  Medication Sig   Accu-Chek Softclix Lancets lancets Use to check fasting blood sugars daily at as needed throughout the day   amLODipine (NORVASC) 10 MG tablet Take 1 tablet (10 mg total) by mouth daily.   blood glucose meter kit and supplies KIT Dispense based on patient and insurance preference. Check blood sugar twice a day   Blood Glucose Monitoring Suppl (ACCU-CHEK GUIDE) w/Device KIT Use to check fasting blood sugars daily at as needed throughout the day   Blood Pressure Monitoring (BLOOD PRESSURE CUFF) MISC 1 each by Does not apply route as needed.   citalopram (CELEXA) 20 MG tablet Take 1 tablet (20 mg total) by mouth daily. Take half a tab for a week and then increase to a full tab.   clindamycin (CLEOCIN T) 1 % SWAB Apply 1 Swab topically 2 (two) times daily.   Continuous Blood Gluc Receiver (DEXCOM G7 RECEIVER) DEVI Use with dexcom g7 sensors   Continuous Blood Gluc Sensor (DEXCOM G7 SENSOR) MISC Replace every 10 days for continuous glucose monitoring   estradiol (ESTRACE VAGINAL) 0.1 MG/GM vaginal cream Place 1 Applicatorful vaginally at bedtime. Use nightly for 3 weeks, then 3 times/week as needed   FARXIGA 5 MG TABS tablet TAKE 1 TABLET BY MOUTH DAILY BEFORE BREAKFAST   fluconazole (DIFLUCAN) 150 MG tablet TAKE 1 TABLET(150 MG) BY MOUTH 1 TIME FOR 1 DOSE   gabapentin (NEURONTIN) 300 MG capsule Take 300 mg by mouth 2 (two) times  daily.   glucose blood (ACCU-CHEK GUIDE) test strip Use to check fasting blood sugars daily at as needed throughout the day   losartan (COZAAR) 100 MG tablet Take 1 tablet (100 mg total) by mouth daily.   rosuvastatin (CRESTOR) 10 MG tablet Take 1 tablet (10 mg total) by mouth daily.   Semaglutide, 2 MG/DOSE, 8 MG/3ML SOPN Inject 2 mg into the skin once a week.   [DISCONTINUED] metFORMIN (GLUCOPHAGE)  500 MG tablet Take 1 tablet (500 mg total) by mouth 2 (two) times daily with a meal.   [DISCONTINUED] losartan (COZAAR) 50 MG tablet TAKE 1 TABLET(50 MG) BY MOUTH DAILY   [DISCONTINUED] metroNIDAZOLE (FLAGYL) 500 MG tablet Take 1 tablet (500 mg total) by mouth 2 (two) times daily. (Patient not taking: Reported on 08/26/2022)   [DISCONTINUED] nitrofurantoin, macrocrystal-monohydrate, (MACROBID) 100 MG capsule Take 1 capsule (100 mg total) by mouth 2 (two) times daily. (Patient not taking: Reported on 08/26/2022)   No facility-administered medications prior to visit.    Review of Systems  Constitutional:  Negative for appetite change, chills, fatigue and fever.  Respiratory:  Negative for chest tightness and shortness of breath.   Cardiovascular:  Negative for chest pain and palpitations.  Gastrointestinal:  Negative for abdominal pain, nausea and vomiting.  Neurological:  Negative for dizziness and weakness.     Objective    BP (!) 157/79 (BP Location: Right Arm, Patient Position: Sitting, Cuff Size: Large)   Pulse 82   Temp 98.6 F (37 C) (Oral)   Resp 16   Wt 252 lb (114.3 kg)   SpO2 100% Comment: room air  BMI 39.47 kg/m   Physical Exam Vitals reviewed.  Constitutional:      Appearance: She is not ill-appearing.  HENT:     Head: Normocephalic.  Eyes:     Conjunctiva/sclera: Conjunctivae normal.  Cardiovascular:     Rate and Rhythm: Normal rate.     Pulses:          Dorsalis pedis pulses are 3+ on the right side and 3+ on the left side.       Posterior tibial pulses are 3+ on the right side and 3+ on the left side.  Pulmonary:     Effort: Pulmonary effort is normal. No respiratory distress.  Feet:     Right foot:     Protective Sensation: 4 sites tested.  4 sites sensed.     Skin integrity: Skin integrity normal.     Toenail Condition: Right toenails are normal.     Left foot:     Protective Sensation: 4 sites tested.  4 sites sensed.     Skin integrity: Skin  integrity normal.     Toenail Condition: Left toenails are normal.  Neurological:     General: No focal deficit present.     Mental Status: She is alert and oriented to person, place, and time.  Psychiatric:        Mood and Affect: Mood normal.        Behavior: Behavior normal.     Results for orders placed or performed in visit on 08/26/22  POCT HgB A1C  Result Value Ref Range   Hemoglobin A1C 5.5 4.0 - 5.6 %   Est. average glucose Bld gHb Est-mCnc 111     Assessment & Plan     Problem List Items Addressed This Visit       Cardiovascular and Mediastinum   Hypertension associated with diabetes ([place])    Again  elevated in office, pt not checking consistently at home. Advised we add HCTZ 12.5 mg in addition to losartan 100 mg and amlodipine 10 mg F/u in office 4 weeks        Relevant Medications   hydrochlorothiazide (MICROZIDE) 12.5 MG capsule   dapagliflozin propanediol (FARXIGA) 5 MG TABS tablet     Endocrine   Type 2 diabetes mellitus without complication, without long-term current use of insulin ([place]) - Primary    Currently managed with metformin 500 mg bid. farxiga 5 mg and ozempic 2 mg. Pt experiencing some hypoglycemic episodes-- d/c metformin 500 mg. Given farxiga samples today as her insurance change is preventing a refill  Last a1c 5.9% today 5.5% On statin, on ace-I/arb Foot exam today uacr , optho utd F/u 4 mo      Relevant Medications   dapagliflozin propanediol (FARXIGA) 5 MG TABS tablet   Other Relevant Orders   POCT HgB A1C (Completed)   Other Visit Diagnoses     Need for immunization against influenza       Relevant Orders   Flu Vaccine QUAD 76moIM (Fluarix, Fluzone & Alfiuria Quad PF) (Completed)     Mammogram is scheduled, pt will reschedule colonoscopy-- cologuard was positive.   Return in about 4 weeks (around 09/23/2022) for hypertension.      I, LMikey Kirschner PA-C have reviewed all documentation for this visit. The documentation on   08/26/2022 for the exam, diagnosis, procedures, and orders are all accurate and complete.  LMikey Kirschner PA-C [facility] [place] [id number]: [phone]: [phone]

## 2022-08-26 NOTE — Assessment & Plan Note (Addendum)
Again elevated in office, pt not checking consistently at home. Advised we add HCTZ 12.5 mg in addition to losartan 100 mg and amlodipine 10 mg F/u in office 4 weeks

## 2022-08-26 NOTE — Assessment & Plan Note (Addendum)
Currently managed with metformin 500 mg bid. farxiga 5 mg and ozempic 2 mg. Pt experiencing some hypoglycemic episodes-- d/c metformin 500 mg. Given farxiga samples today as her insurance change is preventing a refill  Last a1c 5.9% today 5.5% On statin, on ace-I/arb Foot exam today uacr , optho utd F/u 4 mo

## 2022-09-05 ENCOUNTER — Encounter: Payer: Self-pay | Admitting: Physician Assistant

## 2022-09-07 NOTE — Telephone Encounter (Signed)
Processed through [employer]. Contacted and spoke with tamika Confirmation # [id number] Was told to give 24 hours to process

## 2022-09-08 ENCOUNTER — Encounter: Payer: Self-pay | Admitting: Physician Assistant

## 2022-09-08 DIAGNOSIS — E119 Type 2 diabetes mellitus without complications: Secondary | ICD-10-CM

## 2022-09-08 MED ORDER — DAPAGLIFLOZIN PROPANEDIOL 5 MG PO TABS: 5.0000 mg | ORAL_TABLET | Freq: Every day | ORAL | 0 refills | Status: DC

## 2022-09-08 NOTE — Telephone Encounter (Signed)
I called and spoke with patient. I advised her that PA is still pending. Samples placed up front for pick up. Patient plans to pick up samples later this week.

## 2022-09-14 ENCOUNTER — Encounter: Payer: Self-pay | Admitting: Physician Assistant

## 2022-09-14 ENCOUNTER — Other Ambulatory Visit: Payer: Self-pay | Admitting: Physician Assistant

## 2022-09-14 DIAGNOSIS — E119 Type 2 diabetes mellitus without complications: Secondary | ICD-10-CM

## 2022-09-14 DIAGNOSIS — E1159 Type 2 diabetes mellitus with other circulatory complications: Secondary | ICD-10-CM

## 2022-09-15 ENCOUNTER — Encounter: Payer: Self-pay | Admitting: Physician Assistant

## 2022-09-17 ENCOUNTER — Other Ambulatory Visit: Payer: Self-pay

## 2022-09-17 ENCOUNTER — Other Ambulatory Visit: Payer: Medicaid Other | Admitting: Pharmacist

## 2022-09-18 ENCOUNTER — Other Ambulatory Visit: Payer: Medicaid Other | Admitting: Pharmacist

## 2022-09-18 ENCOUNTER — Other Ambulatory Visit: Payer: Self-pay

## 2022-09-18 DIAGNOSIS — E119 Type 2 diabetes mellitus without complications: Secondary | ICD-10-CM

## 2022-09-18 MED ORDER — SEMAGLUTIDE (2 MG/DOSE) 8 MG/3ML ~~LOC~~ SOPN
2.0000 mg | PEN_INJECTOR | SUBCUTANEOUS | 1 refills | Status: DC
  Filled 2022-09-18: qty 3, 28d supply, fill #0
  Filled 2022-10-04 – 2022-10-10 (×2): qty 3, 28d supply, fill #1
  Filled 2022-10-28: qty 3, 28d supply, fill #2
  Filled 2022-12-07: qty 3, 28d supply, fill #3
  Filled 2022-12-27: qty 6, 56d supply, fill #4

## 2022-09-18 MED ORDER — DEXCOM G7 SENSOR MISC: 3 refills | Status: DC

## 2022-09-18 MED ORDER — CITALOPRAM HYDROBROMIDE 20 MG PO TABS: ORAL_TABLET | ORAL | 2 refills | Status: DC

## 2022-09-18 MED ORDER — METFORMIN HCL 500 MG PO TABS
ORAL_TABLET | ORAL | 1 refills | Status: DC
  Filled 2022-10-01: qty 180, fill #0
  Filled 2022-10-04: qty 180, 90d supply, fill #0

## 2022-09-18 MED ORDER — DAPAGLIFLOZIN PROPANEDIOL 5 MG PO TABS
5.0000 mg | ORAL_TABLET | Freq: Every day | ORAL | 1 refills | Status: DC
  Filled 2022-09-18: qty 90, 90d supply, fill #0
  Filled 2022-12-07: qty 90, 90d supply, fill #1

## 2022-09-18 NOTE — Progress Notes (Signed)
09/18/2022 Name: Denise Velez MRN: [id number] DOB: [birth year]  Chief Complaint  Patient presents with   Medication Management   Diabetes   Hypertension   Hyperlipidemia    Denise Velez is a 56 y.o. year old female who presented for a telephone visit.   They were referred to the pharmacist by their PCP for assistance in managing medication access.    Subjective:  Care Team: Primary Care Provider: Mikey Kirschner, PA-C ; Next Scheduled Visit: 09/25/22  Medication Access/Adherence  Current Pharmacy:  [facility] [place], [place] - [address] AT [facility] [place] [address] Phone: [phone] Fax: [phone]   Patient reports affordability concerns with their medications: No  Patient reports access/transportation concerns to their pharmacy: No  Patient reports adherence concerns with their medications:  Yes  reports Medicaid was not approving PA for [place] and Ozempic. Reports [facility] was unable to fill Ozempic due to back order   Diabetes:  Current medications: Farxiga 5 mg daily, Ozempic 2 mg weekly Medications tried in the past: metformin- recent discontinued due to symptoms of hypoglycemia  Reports some constipation, worse since increasing dose of Ozempic.   Hypertension:  Current medications: losartan 100 mg daily, amlodipine 10 mg daily, HCTZ 12.5 mg daily  Patient has an automated, upper arm home BP cuff. Recently acquired, has not taken to the office for validation yet.  Current blood pressure readings readings: reports home reading of ~110/60. Wonders if this is too low.   Patient denies hypotensive s/sx including dizziness, lightheadedness.    Hyperlipidemia/ASCVD Risk Reduction  Current lipid lowering medications: rosuvastatin 10 mg daily - dose increased in July after elevated LDL   Health Maintenance  Health Maintenance Due  Topic Date Due   COVID-19 Vaccine (3 - Moderna series) 03/15/2020    Zoster Vaccines- Shingrix (2 of 2) 07/21/2022     Objective: Lab Results  Component Value Date   HGBA1C 5.5 08/26/2022    Lab Results  Component Value Date   CREATININE 0.65 05/26/2022   BUN 6 05/26/2022   NA 139 05/26/2022   K 4.5 05/26/2022   CL 102 05/26/2022   CO2 25 05/26/2022    Lab Results  Component Value Date   CHOL 182 05/26/2022   HDL 62 05/26/2022   LDLCALC 106 (H) 05/26/2022   TRIG 75 05/26/2022   CHOLHDL 2.9 05/26/2022    Medications Reviewed Today     Reviewed by Osker Mason, RPH-CPP (Pharmacist) on 09/18/22 at 1018  Med List Status: <None>   Medication Order Taking? Sig Documenting Provider Last Dose Status Informant  Accu-Chek Softclix Lancets lancets [id number]  Use to check fasting blood sugars daily at as needed throughout the day Mikey Kirschner, PA-C  Active   amLODipine (NORVASC) 10 MG tablet [id number] Yes Take 1 tablet (10 mg total) by mouth daily. Theresia Lo, NP Taking Active   blood glucose meter kit and supplies KIT [id number]  Dispense based on patient and insurance preference. Check blood sugar twice a day Theresia Lo, NP  Active   Blood Glucose Monitoring Suppl (ACCU-CHEK GUIDE) w/Device KIT [id number]  Use to check fasting blood sugars daily at as needed throughout the day Mikey Kirschner, PA-C  Active   Blood Pressure Monitoring (BLOOD PRESSURE CUFF) MISC [id number]  1 each by Does not apply route as needed. Theresia Lo, NP  Active            Med Note (Tariah Transue, Dilan Fullenwider T  Fri Sep 18, 2022 10:14 AM)    clindamycin (CLEOCIN T) 1 % SWAB [id number] No Apply 1 Swab topically 2 (two) times daily.  Patient not taking: Reported on 09/18/2022   Lurlean Horns, CNM Not Taking Active   dapagliflozin propanediol (FARXIGA) 5 MG TABS tablet [id number] Yes Take 1 tablet (5 mg total) by mouth daily before breakfast. Mikey Kirschner, PA-C Taking Active   estradiol (ESTRACE VAGINAL) 0.1 MG/GM vaginal cream [id number] No Place 1  Applicatorful vaginally at bedtime. Use nightly for 3 weeks, then 3 times/week as needed  Patient not taking: Reported on 09/18/2022   Lurlean Horns, CNM Not Taking Active   FARXIGA 5 MG TABS tablet [id number] Yes TAKE 1 TABLET BY MOUTH DAILY BEFORE Alvina Filbert, MD Taking Active     Discontinued 09/18/22 1018 (Completed Course)   glucose blood (ACCU-CHEK GUIDE) test strip [id number]  Use to check fasting blood sugars daily at as needed throughout the day Mikey Kirschner, PA-C  Active   hydrochlorothiazide (MICROZIDE) 12.5 MG capsule [id number] Yes Take 1 capsule (12.5 mg total) by mouth daily. Mikey Kirschner, PA-C Taking Active   losartan (COZAAR) 100 MG tablet [id number] Yes Take 1 tablet (100 mg total) by mouth daily. Theresia Lo, NP Taking Active   rosuvastatin (CRESTOR) 10 MG tablet [id number] Yes Take 1 tablet (10 mg total) by mouth daily. Mikey Kirschner, PA-C Taking Active   Semaglutide, 2 MG/DOSE, 8 MG/3ML SOPN [id number] Yes Inject 2 mg into the skin once a week. Mikey Kirschner, PA-C Taking Active               Assessment/Plan:   Diabetes: - Currently controlled but with access concerns.  - Completed PA for Ozempic and Farxiga per [facility], noting history of metformin. Approved Wilder Glade [phone] W); Ozempic [phone] W). Collaborated with Engineer, production at [facility]. They do have [place] in stock. Patient requests to transfer her prescriptions to this pharmacy. Discussed with PCP - will send script for Ozempic and Wilder Glade to that pharmacy for fill today; will collaborate with pharmacy to transfer other medications from [place].   Hypertension: - Currently controlled per home reading - Encouraged to take home cuff to PCP office next week for validation.  - Recommend to continue current regimen at this time.    Hyperlipidemia/ASCVD Risk Reduction: - Currently uncontrolled but anticipated to be improved - Reviewed long term  complications of uncontrolled cholesterol - Recommend to continue current regimen at this time  Follow Up Plan: will follow outcome of PCP visit next week  Catie Hedwig Morton, PharmD, [place] [phone]

## 2022-09-18 NOTE — Patient Instructions (Signed)
Eliah,   It was great talking to you today!  We've sent the [place] and Ozempic to the [employer] at [facility] be in touch when the prescriptions are ready.   Check your blood pressure twice weekly, and any time you have concerning symptoms like headache, chest pain, dizziness, shortness of breath, or vision changes.   Our goal is less than 130/80.  To appropriately check your blood pressure, make sure you do the following:  1) Avoid caffeine, exercise, or tobacco products for 30 minutes before checking. Empty your bladder. 2) Sit with your back supported in a flat-backed chair. Rest your arm on something flat (arm of the chair, table, etc). 3) Sit still with your feet flat on the floor, resting, for at least 5 minutes.  4) Check your blood pressure. Take 1-2 readings.  5) Write down these readings and bring with you to any provider appointments.  Bring your home blood pressure machine with you to a provider's office for accuracy comparison at least once a year.   Make sure you take your blood pressure medications before you come to any office visit, even if you were asked to fast for labs.   Take care!  Catie Hedwig Morton, PharmD, [facility] [phone]

## 2022-09-21 ENCOUNTER — Other Ambulatory Visit (HOSPITAL_COMMUNITY): Payer: Self-pay

## 2022-09-25 ENCOUNTER — Encounter: Payer: Self-pay | Admitting: Physician Assistant

## 2022-09-25 ENCOUNTER — Ambulatory Visit: Payer: Medicaid Other | Admitting: Physician Assistant

## 2022-09-25 VITALS — BP 120/69 | HR 82 | Resp 16 | Ht 68.0 in | Wt 255.0 lb

## 2022-09-25 DIAGNOSIS — E1159 Type 2 diabetes mellitus with other circulatory complications: Secondary | ICD-10-CM | POA: Diagnosis not present

## 2022-09-25 DIAGNOSIS — Z1331 Encounter for screening for depression: Secondary | ICD-10-CM | POA: Insufficient documentation

## 2022-09-25 DIAGNOSIS — I152 Hypertension secondary to endocrine disorders: Secondary | ICD-10-CM | POA: Diagnosis not present

## 2022-09-25 NOTE — Assessment & Plan Note (Addendum)
Chronic, now moderately controlled in office with excellent control at home Advised signs of hypotension Managed with losartan 100 mg, amlodipine 10 mg, hctz 12.5 mg. Will repeat cmp given hctz addition F/u 6 mo

## 2022-09-25 NOTE — Progress Notes (Signed)
I,April Miller,acting as a Education administrator for [employer], PA-C.,have documented all relevant documentation on the behalf of Mikey Kirschner, PA-C,as directed by  Mikey Kirschner, PA-C while in the presence of Mikey Kirschner, PA-C.   Established patient visit   Patient: Denise Velez   DOB: 1966-06-27   56 y.o. Female  MRN: [id number] Visit Date: 09/25/2022  Today's healthcare provider: Mikey Kirschner, PA-C   Chief Complaint  Patient presents with   Hypertension   Follow-up   Subjective    HPI  Hypertension, follow-up  BP Readings from Last 3 Encounters:  09/25/22 120/69  08/26/22 (!) 157/79  07/16/22 (!) 145/82   Wt Readings from Last 3 Encounters:  09/25/22 255 lb (115.7 kg)  08/26/22 252 lb (114.3 kg)  07/16/22 255 lb 9.6 oz (115.9 kg)     She was last seen for hypertension 1 months ago.  Management since that visit includes; started HCTZ 12.5 mg.  Outside blood pressures are 120/69. 106/62 at the lowest  Pertinent labs Lab Results  Component Value Date   CHOL 182 05/26/2022   HDL 62 05/26/2022   LDLCALC 106 (H) 05/26/2022   TRIG 75 05/26/2022   CHOLHDL 2.9 05/26/2022   Lab Results  Component Value Date   NA 139 05/26/2022   K 4.5 05/26/2022   CREATININE 0.65 05/26/2022   EGFR 105 05/26/2022   GLUCOSE 89 05/26/2022   TSH 1.360 07/21/2022     The 10-year ASCVD risk score (Arnett DK, et al., 2019) is: 6.9%  ---------------------------------------------------------------------------------------------------     05/26/2022   11:23 AM 02/19/2022    1:34 PM 01/15/2022   11:36 AM  PHQ9 SCORE ONLY  PHQ-9 Total Score 17 0 1    Medications: Outpatient Medications Prior to Visit  Medication Sig   Accu-Chek Softclix Lancets lancets Use to check fasting blood sugars daily at as needed throughout the day   amLODipine (NORVASC) 10 MG tablet Take 1 tablet (10 mg total) by mouth daily.   blood glucose meter kit and supplies KIT Dispense based on patient and insurance  preference. Check blood sugar twice a day   Blood Glucose Monitoring Suppl (ACCU-CHEK GUIDE) w/Device KIT Use to check fasting blood sugars daily at as needed throughout the day   Blood Pressure Monitoring (BLOOD PRESSURE CUFF) MISC 1 each by Does not apply route as needed.   dapagliflozin propanediol (FARXIGA) 5 MG TABS tablet Take 1 tablet (5 mg total) by mouth daily.   glucose blood (ACCU-CHEK GUIDE) test strip Use to check fasting blood sugars daily at as needed throughout the day   hydrochlorothiazide (MICROZIDE) 12.5 MG capsule Take 1 capsule (12.5 mg total) by mouth daily.   losartan (COZAAR) 100 MG tablet Take 1 tablet (100 mg total) by mouth daily.   metFORMIN (GLUCOPHAGE) 500 MG tablet Take one tablet by mouth twice a day with a meal   rosuvastatin (CRESTOR) 10 MG tablet Take 1 tablet (10 mg total) by mouth daily.   Semaglutide, 2 MG/DOSE, 8 MG/3ML SOPN Inject 2 mg into the skin once a week.   [DISCONTINUED] citalopram (CELEXA) 20 MG tablet Take 1/2 tablet by mouth daily for 1 week, then 1 tablet daily   [DISCONTINUED] clindamycin (CLEOCIN T) 1 % SWAB Apply 1 Swab topically 2 (two) times daily. (Patient not taking: Reported on 09/18/2022)   [DISCONTINUED] Continuous Blood Gluc Sensor (DEXCOM G7 SENSOR) MISC Replace every 10 days for continuous glucose monitoring   [DISCONTINUED] estradiol (ESTRACE VAGINAL) 0.1 MG/GM vaginal  cream Place 1 Applicatorful vaginally at bedtime. Use nightly for 3 weeks, then 3 times/week as needed (Patient not taking: Reported on 09/18/2022)   No facility-administered medications prior to visit.    Review of Systems  Constitutional:  Negative for fatigue and fever.  Respiratory:  Negative for cough and shortness of breath.   Cardiovascular:  Negative for chest pain and leg swelling.  Gastrointestinal:  Negative for abdominal pain.  Neurological:  Negative for dizziness and headaches.      Objective    BP 120/69 Comment: home value  Pulse 82   Resp 16    Ht _0  (1.727 m)   Wt 255 lb (115.7 kg)   SpO2 100%   BMI 38.77 kg/m   Physical Exam Vitals reviewed.  Constitutional:      Appearance: She is not ill-appearing.  HENT:     Head: Normocephalic.  Eyes:     Conjunctiva/sclera: Conjunctivae normal.  Cardiovascular:     Rate and Rhythm: Normal rate.  Pulmonary:     Effort: Pulmonary effort is normal. No respiratory distress.  Neurological:     General: No focal deficit present.     Mental Status: She is alert and oriented to person, place, and time.  Psychiatric:        Mood and Affect: Mood normal.        Behavior: Behavior normal.     No results found for any visits on 09/25/22.  Assessment & Plan     Problem List Items Addressed This Visit       Cardiovascular and Mediastinum   Hypertension associated with diabetes ([place]) - Primary    Chronic, now moderately controlled in office with excellent control at home Advised signs of hypotension Managed with losartan 100 mg, amlodipine 10 mg, hctz 12.5 mg. Will repeat cmp given hctz addition F/u 6 mo       Relevant Orders   Comprehensive Metabolic Panel (CMET)     Other   Positive depression screening    Discussed anxiety, depression Pt has made good lifestyle choices, has family and friends she talks to  Trying to set up distractions, take classes, part time job  Will continue to monitor        Return in about 3 months (around 12/26/2022) for DMII.      I, Mikey Kirschner, PA-C have reviewed all documentation for this visit. The documentation on  09/25/2022  for the exam, diagnosis, procedures, and orders are all accurate and complete.  Mikey Kirschner, PA-C [facility] [address] [place], [place], [address] Office: [phone] Fax: [place]

## 2022-09-25 NOTE — Assessment & Plan Note (Signed)
Discussed anxiety, depression Pt has made good lifestyle choices, has family and friends she talks to  Trying to set up distractions, take classes, part time job  Will continue to monitor

## 2022-09-26 LAB — COMPREHENSIVE METABOLIC PANEL
ALT: 18 IU/L (ref 0–32)
AST: 17 IU/L (ref 0–40)
Albumin/Globulin Ratio: 1.6 (ref 1.2–2.2)
Albumin: 4.4 g/dL (ref 3.8–4.9)
Alkaline Phosphatase: 123 IU/L — ABNORMAL HIGH (ref 44–121)
BUN/Creatinine Ratio: 16 (ref 9–23)
BUN: 12 mg/dL (ref 6–24)
Bilirubin Total: 0.3 mg/dL (ref 0.0–1.2)
CO2: 24 mmol/L (ref 20–29)
Calcium: 9.9 mg/dL (ref 8.7–10.2)
Chloride: 100 mmol/L (ref 96–106)
Creatinine, Ser: 0.75 mg/dL (ref 0.57–1.00)
Globulin, Total: 2.7 g/dL (ref 1.5–4.5)
Glucose: 77 mg/dL (ref 70–99)
Potassium: 4.4 mmol/L (ref 3.5–5.2)
Sodium: 141 mmol/L (ref 134–144)
Total Protein: 7.1 g/dL (ref 6.0–8.5)
eGFR: 95 mL/min/{1.73_m2} (ref >59)

## 2022-09-28 ENCOUNTER — Encounter: Payer: Self-pay | Admitting: Physician Assistant

## 2022-10-01 ENCOUNTER — Other Ambulatory Visit: Payer: Self-pay

## 2022-10-02 ENCOUNTER — Other Ambulatory Visit: Payer: Self-pay

## 2022-10-04 ENCOUNTER — Other Ambulatory Visit: Payer: Self-pay

## 2022-10-05 ENCOUNTER — Other Ambulatory Visit: Payer: Self-pay | Admitting: Obstetrics

## 2022-10-05 ENCOUNTER — Other Ambulatory Visit: Payer: Self-pay

## 2022-10-06 ENCOUNTER — Other Ambulatory Visit: Payer: Self-pay

## 2022-10-09 ENCOUNTER — Other Ambulatory Visit: Payer: Self-pay

## 2022-10-11 ENCOUNTER — Other Ambulatory Visit: Payer: Self-pay

## 2022-10-12 ENCOUNTER — Other Ambulatory Visit: Payer: Self-pay

## 2022-10-14 ENCOUNTER — Telehealth: Payer: Self-pay

## 2022-10-14 ENCOUNTER — Other Ambulatory Visit: Payer: Self-pay

## 2022-10-14 MED ORDER — ESTRADIOL 0.1 MG/GM VA CREA
1.0000 | TOPICAL_CREAM | Freq: Every day | VAGINAL | 11 refills | Status: DC
  Filled 2022-10-14: qty 42.5, 10d supply, fill #0
  Filled 2022-10-19 – 2022-10-22 (×3): qty 42.5, 30d supply, fill #0
  Filled 2022-10-28: qty 42.5, 11d supply, fill #0
  Filled 2022-11-03: qty 42.5, fill #0
  Filled 2022-11-17 – 2022-12-07 (×3): qty 42.5, 30d supply, fill #0
  Filled 2022-12-27: qty 42.5, 90d supply, fill #0

## 2022-10-14 NOTE — Telephone Encounter (Signed)
Patient contacted office requesting refill on Estrace vaginal cream 0.1mg . According to patients chart her PCP had discontinued medication on 09/18/22 as it was reported patient was not taking. Patient states that she has not stopped taking medication and is requesting prescription sent today to [facility] pharmacy. Please review and advise. KW

## 2022-10-14 NOTE — Telephone Encounter (Signed)
I contacted [facility] pharmacy for clarification they state that prescription was transferred from [facility] on 09/18/22 to [facility] and that was last fill date of Rx, they states that a request was sent on 10/10/22 requesting refill which was denied by Guadlupe Spanish. Patient states that she never received prescription from [facility] pharmacy because medication was not in stock. Reviewed medication list with Guadlupe Spanish who has approved to send in refill request today to [facility] pharmacy. KW

## 2022-10-19 ENCOUNTER — Other Ambulatory Visit: Payer: Self-pay

## 2022-10-22 ENCOUNTER — Other Ambulatory Visit: Payer: Self-pay

## 2022-10-23 ENCOUNTER — Other Ambulatory Visit: Payer: Self-pay

## 2022-10-28 ENCOUNTER — Other Ambulatory Visit: Payer: Self-pay

## 2022-11-02 ENCOUNTER — Other Ambulatory Visit: Payer: Self-pay

## 2022-11-03 ENCOUNTER — Other Ambulatory Visit: Payer: Self-pay

## 2022-11-03 NOTE — Telephone Encounter (Signed)
Pt calling to check on status of PA on her vaginal cream; Pharm sent PA 10/19/22 and hasn't received a response. Please call back or call the pharm.  [phone]

## 2022-11-10 ENCOUNTER — Other Ambulatory Visit: Payer: Self-pay | Admitting: Nurse Practitioner

## 2022-11-10 DIAGNOSIS — I10 Essential (primary) hypertension: Secondary | ICD-10-CM

## 2022-11-17 ENCOUNTER — Other Ambulatory Visit: Payer: Self-pay

## 2022-12-01 ENCOUNTER — Other Ambulatory Visit: Payer: Self-pay | Admitting: Physician Assistant

## 2022-12-01 ENCOUNTER — Other Ambulatory Visit: Payer: Self-pay

## 2022-12-01 DIAGNOSIS — I10 Essential (primary) hypertension: Secondary | ICD-10-CM

## 2022-12-01 MED ORDER — LOSARTAN POTASSIUM 100 MG PO TABS
100.0000 mg | ORAL_TABLET | Freq: Every day | ORAL | 1 refills | Status: DC
  Filled 2022-12-01: qty 90, 90d supply, fill #0
  Filled 2023-02-21: qty 90, 90d supply, fill #1

## 2022-12-03 ENCOUNTER — Other Ambulatory Visit: Payer: Self-pay

## 2022-12-07 ENCOUNTER — Other Ambulatory Visit: Payer: Self-pay

## 2022-12-07 ENCOUNTER — Encounter: Payer: Self-pay | Admitting: Pharmacist

## 2022-12-08 ENCOUNTER — Other Ambulatory Visit: Payer: Self-pay

## 2022-12-10 ENCOUNTER — Other Ambulatory Visit: Payer: Self-pay

## 2022-12-10 ENCOUNTER — Encounter: Payer: Self-pay | Admitting: Physician Assistant

## 2022-12-10 NOTE — Progress Notes (Signed)
Patient had abnormal cologuard on 06/04/22 and now ready for colonoscopy to be done.

## 2022-12-11 ENCOUNTER — Other Ambulatory Visit: Payer: Self-pay | Admitting: Physician Assistant

## 2022-12-11 DIAGNOSIS — R195 Other fecal abnormalities: Secondary | ICD-10-CM

## 2022-12-14 ENCOUNTER — Other Ambulatory Visit: Payer: Self-pay

## 2022-12-14 ENCOUNTER — Telehealth: Payer: Self-pay

## 2022-12-14 DIAGNOSIS — R195 Other fecal abnormalities: Secondary | ICD-10-CM

## 2022-12-14 DIAGNOSIS — Z1211 Encounter for screening for malignant neoplasm of colon: Secondary | ICD-10-CM

## 2022-12-14 NOTE — Telephone Encounter (Signed)
Gastroenterology Pre-Procedure Review  Request Date: 12/28/22 Requesting Physician: Dr. Vicente Males  PATIENT REVIEW QUESTIONS: The patient responded to the following health history questions as indicated:    1. Are you having any GI issues? no 2. Do you have a personal history of Polyps? no 3. Do you have a family history of Colon Cancer or Polyps? no 4. Diabetes Mellitus? yes (Pt has been advised to stop Ozempic Injections on 12/20/22, stop [place] date noted on instructions for 02/09. She states she no longer takes Metformin) 5. Joint replacements in the past 12 months?no 6. Major health problems in the past 3 months?no 7. Any artificial heart valves, MVP, or defibrillator?no    MEDICATIONS & ALLERGIES:    Patient reports the following regarding taking any anticoagulation/antiplatelet therapy:   Plavix, Coumadin, Eliquis, Xarelto, Lovenox, Pradaxa, Brilinta, or Effient? no Aspirin? no  Patient confirms/reports the following medications:  Current Outpatient Medications  Medication Sig Dispense Refill   Accu-Chek Softclix Lancets lancets Use to check fasting blood sugars daily at as needed throughout the day 100 each 3   amLODipine (NORVASC) 10 MG tablet Take 1 tablet (10 mg total) by mouth daily. 90 tablet 2   blood glucose meter kit and supplies KIT Dispense based on patient and insurance preference. Check blood sugar twice a day 1 each 0   Blood Glucose Monitoring Suppl (ACCU-CHEK GUIDE) w/Device KIT Use to check fasting blood sugars daily at as needed throughout the day 1 kit 0   Blood Pressure Monitoring (BLOOD PRESSURE CUFF) MISC 1 each by Does not apply route as needed. 1 each 0   dapagliflozin propanediol (FARXIGA) 5 MG TABS tablet Take 1 tablet (5 mg total) by mouth daily. 90 tablet 1   estradiol (ESTRACE VAGINAL) 0.1 MG/GM vaginal cream Place 1 Applicatorful vaginally at bedtime. Use nightly for 3 weeks, then 3 times/week as needed 42.5 g 11   glucose blood (ACCU-CHEK GUIDE) test  strip Use to check fasting blood sugars daily at as needed throughout the day 100 each 4   hydrochlorothiazide (MICROZIDE) 12.5 MG capsule Take 1 capsule (12.5 mg total) by mouth daily. 90 capsule 1   losartan (COZAAR) 100 MG tablet Take 1 tablet (100 mg total) by mouth daily. 90 tablet 1   metFORMIN (GLUCOPHAGE) 500 MG tablet Take one tablet by mouth twice a day with a meal 180 tablet 1   rosuvastatin (CRESTOR) 10 MG tablet Take 1 tablet (10 mg total) by mouth daily. 90 tablet 3   Semaglutide, 2 MG/DOSE, 8 MG/3ML SOPN Inject 2 mg into the skin once a week. 9 mL 1   No current facility-administered medications for this visit.    Patient confirms/reports the following allergies:  Allergies  Allergen Reactions   Sulfa Antibiotics Rash    No orders of the defined types were placed in this encounter.   AUTHORIZATION INFORMATION Primary Insurance: 1D#: Group #:  Secondary Insurance: 1D#: Group #:  SCHEDULE INFORMATION: Date: 12/28/22 Time: Location: [facility]

## 2022-12-15 ENCOUNTER — Encounter: Payer: Self-pay | Admitting: Physician Assistant

## 2022-12-16 ENCOUNTER — Other Ambulatory Visit: Payer: Self-pay | Admitting: Physician Assistant

## 2022-12-16 DIAGNOSIS — E119 Type 2 diabetes mellitus without complications: Secondary | ICD-10-CM

## 2022-12-27 ENCOUNTER — Other Ambulatory Visit: Payer: Self-pay | Admitting: Nurse Practitioner

## 2022-12-28 ENCOUNTER — Encounter: Admission: RE | Payer: Self-pay | Source: Home / Self Care

## 2022-12-28 ENCOUNTER — Telehealth: Payer: Self-pay

## 2022-12-28 ENCOUNTER — Other Ambulatory Visit: Payer: Self-pay

## 2022-12-28 ENCOUNTER — Ambulatory Visit: Admission: RE | Admit: 2022-12-28 | Payer: Medicaid Other | Source: Home / Self Care | Admitting: Gastroenterology

## 2022-12-28 SURGERY — COLONOSCOPY WITH PROPOFOL
Anesthesia: General

## 2022-12-28 MED ORDER — METFORMIN HCL 500 MG PO TABS
500.0000 mg | ORAL_TABLET | Freq: Two times a day (BID) | ORAL | 1 refills | Status: DC
  Filled 2022-12-28: qty 180, 90d supply, fill #0
  Filled 2023-04-02: qty 180, 90d supply, fill #1

## 2022-12-28 NOTE — Telephone Encounter (Signed)
Denise Velez called triage stating that its a billing error, and resend it in. Its regarding her Estradiol cream. She also stated she's not using [facility] anymore she's at [facility].  [name] would you want me to try sending it in at [facility] to see if it gets approved?   [phone] option 5 case # [id number]

## 2022-12-29 ENCOUNTER — Other Ambulatory Visit: Payer: Self-pay

## 2022-12-29 ENCOUNTER — Ambulatory Visit
Admission: RE | Admit: 2022-12-29 | Discharge: 2022-12-29 | Disposition: A | Discharge status: Home / Self Care | Payer: Medicaid Other | Source: Ambulatory Visit | Attending: Obstetrics | Admitting: Obstetrics

## 2022-12-29 DIAGNOSIS — Z1231 Encounter for screening mammogram for malignant neoplasm of breast: Secondary | ICD-10-CM | POA: Insufficient documentation

## 2022-12-29 DIAGNOSIS — Z01419 Encounter for gynecological examination (general) (routine) without abnormal findings: Secondary | ICD-10-CM

## 2022-12-29 MED ORDER — ESTRADIOL 0.1 MG/GM VA CREA
1.0000 | TOPICAL_CREAM | Freq: Every day | VAGINAL | 11 refills | Status: DC
  Filled 2022-12-29: qty 42.5, 21d supply, fill #0
  Filled 2022-12-30: qty 42.5, 30d supply, fill #0

## 2022-12-30 ENCOUNTER — Other Ambulatory Visit: Payer: Self-pay

## 2022-12-31 ENCOUNTER — Other Ambulatory Visit: Payer: Self-pay | Admitting: Obstetrics

## 2022-12-31 DIAGNOSIS — N63 Unspecified lump in unspecified breast: Secondary | ICD-10-CM

## 2022-12-31 DIAGNOSIS — R921 Mammographic calcification found on diagnostic imaging of breast: Secondary | ICD-10-CM

## 2022-12-31 DIAGNOSIS — R928 Other abnormal and inconclusive findings on diagnostic imaging of breast: Secondary | ICD-10-CM

## 2023-01-07 ENCOUNTER — Other Ambulatory Visit: Payer: Self-pay

## 2023-01-08 ENCOUNTER — Encounter: Payer: Self-pay | Admitting: Physician Assistant

## 2023-01-08 ENCOUNTER — Ambulatory Visit
Admission: RE | Admit: 2023-01-08 | Discharge: 2023-01-08 | Disposition: A | Discharge status: Home / Self Care | Payer: Medicaid Other | Source: Ambulatory Visit | Attending: Obstetrics | Admitting: Obstetrics

## 2023-01-08 DIAGNOSIS — R928 Other abnormal and inconclusive findings on diagnostic imaging of breast: Secondary | ICD-10-CM

## 2023-01-08 DIAGNOSIS — R921 Mammographic calcification found on diagnostic imaging of breast: Secondary | ICD-10-CM

## 2023-01-08 DIAGNOSIS — N63 Unspecified lump in unspecified breast: Secondary | ICD-10-CM | POA: Insufficient documentation

## 2023-01-11 ENCOUNTER — Other Ambulatory Visit: Payer: Self-pay | Admitting: Obstetrics

## 2023-01-11 DIAGNOSIS — R921 Mammographic calcification found on diagnostic imaging of breast: Secondary | ICD-10-CM

## 2023-01-11 DIAGNOSIS — R928 Other abnormal and inconclusive findings on diagnostic imaging of breast: Secondary | ICD-10-CM

## 2023-01-12 ENCOUNTER — Telehealth: Payer: Self-pay

## 2023-01-13 ENCOUNTER — Ambulatory Visit (INDEPENDENT_AMBULATORY_CARE_PROVIDER_SITE_OTHER): Payer: Medicaid Other | Admitting: Physician Assistant

## 2023-01-13 ENCOUNTER — Other Ambulatory Visit: Payer: Self-pay | Admitting: Obstetrics

## 2023-01-13 ENCOUNTER — Other Ambulatory Visit: Payer: Self-pay

## 2023-01-13 ENCOUNTER — Encounter: Payer: Self-pay | Admitting: Obstetrics

## 2023-01-13 DIAGNOSIS — R928 Other abnormal and inconclusive findings on diagnostic imaging of breast: Secondary | ICD-10-CM

## 2023-01-13 DIAGNOSIS — Z23 Encounter for immunization: Secondary | ICD-10-CM

## 2023-01-13 DIAGNOSIS — R921 Mammographic calcification found on diagnostic imaging of breast: Secondary | ICD-10-CM

## 2023-01-13 MED ORDER — PREMARIN 0.625 MG/GM VA CREA
1.0000 | TOPICAL_CREAM | VAGINAL | 12 refills | Status: DC
  Filled 2023-01-13: qty 30, 30d supply, fill #0
  Filled 2023-02-07: qty 30, 30d supply, fill #1
  Filled 2023-03-08: qty 30, 30d supply, fill #2
  Filled 2023-04-09: qty 30, 30d supply, fill #3

## 2023-01-20 ENCOUNTER — Ambulatory Visit
Admission: RE | Admit: 2023-01-20 | Discharge: 2023-01-20 | Disposition: A | Discharge status: Home / Self Care | Payer: Medicaid Other | Source: Ambulatory Visit | Attending: Obstetrics | Admitting: Obstetrics

## 2023-01-20 DIAGNOSIS — R921 Mammographic calcification found on diagnostic imaging of breast: Secondary | ICD-10-CM

## 2023-01-20 DIAGNOSIS — R928 Other abnormal and inconclusive findings on diagnostic imaging of breast: Secondary | ICD-10-CM

## 2023-01-20 HISTORY — PX: BREAST BIOPSY: SHX20

## 2023-01-20 MED ORDER — LIDOCAINE-EPINEPHRINE 1 %-1:100000 IJ SOLN
20.0000 mL | Freq: Once | INTRAMUSCULAR | Status: AC
  Administered 2023-01-20: 20 mL

## 2023-01-20 MED ORDER — LIDOCAINE HCL (PF) 1 % IJ SOLN
5.0000 mL | Freq: Once | INTRAMUSCULAR | Status: AC
  Administered 2023-01-20: 5 mL

## 2023-01-22 LAB — SURGICAL PATHOLOGY

## 2023-02-07 ENCOUNTER — Other Ambulatory Visit: Payer: Self-pay

## 2023-02-07 ENCOUNTER — Other Ambulatory Visit: Payer: Self-pay | Admitting: Physician Assistant

## 2023-02-07 DIAGNOSIS — E119 Type 2 diabetes mellitus without complications: Secondary | ICD-10-CM

## 2023-02-08 ENCOUNTER — Other Ambulatory Visit: Payer: Self-pay

## 2023-02-08 MED FILL — Semaglutide Soln Pen-inj 2 MG/DOSE (8 MG/3ML): SUBCUTANEOUS | 84 days supply | Qty: 9 | Fill #0 | Status: CN

## 2023-02-10 ENCOUNTER — Other Ambulatory Visit: Payer: Self-pay | Admitting: Physician Assistant

## 2023-02-10 ENCOUNTER — Other Ambulatory Visit: Payer: Self-pay

## 2023-02-10 DIAGNOSIS — E1159 Type 2 diabetes mellitus with other circulatory complications: Secondary | ICD-10-CM

## 2023-02-10 MED ORDER — HYDROCHLOROTHIAZIDE 12.5 MG PO CAPS
12.5000 mg | ORAL_CAPSULE | Freq: Every day | ORAL | 1 refills | Status: DC
  Filled 2023-02-10: qty 90, 90d supply, fill #0
  Filled 2023-05-08: qty 90, 90d supply, fill #1

## 2023-02-11 ENCOUNTER — Other Ambulatory Visit: Payer: Self-pay

## 2023-02-11 MED FILL — Semaglutide Soln Pen-inj 2 MG/DOSE (8 MG/3ML): SUBCUTANEOUS | 84 days supply | Qty: 9 | Fill #0 | Status: CN

## 2023-02-14 ENCOUNTER — Encounter: Payer: Self-pay | Admitting: Physician Assistant

## 2023-02-15 ENCOUNTER — Other Ambulatory Visit: Payer: Self-pay

## 2023-02-16 ENCOUNTER — Other Ambulatory Visit: Payer: Self-pay

## 2023-02-16 ENCOUNTER — Ambulatory Visit (INDEPENDENT_AMBULATORY_CARE_PROVIDER_SITE_OTHER): Payer: Medicaid Other | Admitting: Physician Assistant

## 2023-02-16 ENCOUNTER — Encounter: Payer: Self-pay | Admitting: Physician Assistant

## 2023-02-16 VITALS — BP 140/88 | HR 86 | Ht 67.0 in | Wt 264.7 lb

## 2023-02-16 DIAGNOSIS — E119 Type 2 diabetes mellitus without complications: Secondary | ICD-10-CM | POA: Diagnosis not present

## 2023-02-16 LAB — POCT GLYCOSYLATED HEMOGLOBIN (HGB A1C): Hemoglobin A1C: 5.5 % (ref 4.0–5.6)

## 2023-02-16 MED ORDER — TIRZEPATIDE 7.5 MG/0.5ML ~~LOC~~ SOAJ
7.5000 mg | SUBCUTANEOUS | 1 refills | Status: DC
  Filled 2023-02-16: qty 2, 28d supply, fill #0
  Filled 2023-03-08 – 2023-03-12 (×2): qty 2, 28d supply, fill #1

## 2023-02-16 NOTE — Progress Notes (Signed)
I,[name],acting as a Neurosurgeon for [employer], PA-C.,have documented all relevant documentation on the behalf of Alfredia Ferguson, PA-C,as directed by  Alfredia Ferguson, PA-C while in the presence of Alfredia Ferguson, PA-C.   Established patient visit   Patient: Denise Velez   DOB: 22-Nov-1965   57 y.o. Female  MRN: [id number] Visit Date: 02/16/2023  Today's healthcare provider: Alfredia Ferguson, PA-C   Cc. DM II f/u  Subjective    HPI  Diabetes Mellitus Type II, Follow-up  Lab Results  Component Value Date   HGBA1C 5.5 02/16/2023   HGBA1C 5.5 08/26/2022   HGBA1C 5.9 (H) 05/26/2022   Wt Readings from Last 3 Encounters:  02/16/23 264 lb 11.2 oz (120.1 kg)  09/25/22 255 lb (115.7 kg)  08/26/22 252 lb (114.3 kg)   Last seen for diabetes 4 months ago.  Management since then includes d/c metformin 500 mg. Given farxiga 5 mg  samples today as her insurance change is preventing a refill . She reports excellent compliance with treatment. She is not having side effects.   Pt reports being fine with her medications, but is not losing any additional weight. She is curious about switching to Metformin for additional weight loss benefits  Symptoms: No fatigue No foot ulcerations  No appetite changes No nausea  No paresthesia of the feet  No polydipsia  No polyuria No visual disturbances   No vomiting     Home blood sugar records:  68-165   Current insulin regiment:none Most Recent Eye Exam: needs to update Pertinent Labs: Lab Results  Component Value Date   CHOL 182 05/26/2022   HDL 62 05/26/2022   LDLCALC 106 (H) 05/26/2022   TRIG 75 05/26/2022   CHOLHDL 2.9 05/26/2022   Lab Results  Component Value Date   NA 141 09/25/2022   K 4.4 09/25/2022   CREATININE 0.75 09/25/2022   EGFR 95 09/25/2022   LABMICR 10.7 05/26/2022   MICRALBCREAT 14 05/26/2022      ---------------------------------------------------------------------------------------------------   Medications: Outpatient Medications Prior to Visit  Medication Sig   Accu-Chek Softclix Lancets lancets Use to check fasting blood sugars daily at as needed throughout the day   amLODipine (NORVASC) 10 MG tablet Take 1 tablet (10 mg total) by mouth daily.   blood glucose meter kit and supplies KIT Dispense based on patient and insurance preference. Check blood sugar twice a day   Blood Glucose Monitoring Suppl (ACCU-CHEK GUIDE) w/Device KIT Use to check fasting blood sugars daily at as needed throughout the day   Blood Pressure Monitoring (BLOOD PRESSURE CUFF) MISC 1 each by Does not apply route as needed.   conjugated estrogens (PREMARIN) vaginal cream Place 1/3 applicator every other night at bedtime   dapagliflozin propanediol (FARXIGA) 5 MG TABS tablet Take 1 tablet (5 mg total) by mouth daily.   estradiol (ESTRACE VAGINAL) 0.1 MG/GM vaginal cream Place 1 Applicatorful vaginally at bedtime. Use nightly for 3 weeks, then 3 times/week as needed   glucose blood (ACCU-CHEK GUIDE) test strip Use to check fasting blood sugars daily at as needed throughout the day   hydrochlorothiazide (MICROZIDE) 12.5 MG capsule Take 1 capsule (12.5 mg total) by mouth daily.   losartan (COZAAR) 100 MG tablet Take 1 tablet (100 mg total) by mouth daily.   metFORMIN (GLUCOPHAGE) 500 MG tablet Take 1 tablet (500 mg total) by mouth 2 (two) times daily with a meal.   rosuvastatin (CRESTOR) 10 MG tablet Take 1 tablet (10 mg total)  by mouth daily.   [DISCONTINUED] Semaglutide, 2 MG/DOSE, (OZEMPIC, 2 MG/DOSE,) 8 MG/3ML SOPN Inject 2 mg into the skin once a week.   No facility-administered medications prior to visit.    Review of Systems  Constitutional:  Negative for fatigue and fever.  Respiratory:  Negative for cough and shortness of breath.   Cardiovascular:  Negative for chest pain and leg swelling.   Gastrointestinal:  Negative for abdominal pain.  Neurological:  Negative for dizziness and headaches.      Objective    BP (!) 140/88 (BP Location: Right Arm, Patient Position: Sitting, Cuff Size: Large)   Pulse 86   Ht  (1.702 m)   Wt 264 lb 11.2 oz (120.1 kg)   LMP  (LMP Unknown)   SpO2 100%   BMI 41.46 kg/m  Blood pressure (!) 140/88, pulse 86, height  (1.702 m), weight 264 lb 11.2 oz (120.1 kg), SpO2 100 %.   Physical Exam Vitals reviewed.  Constitutional:      Appearance: She is not ill-appearing.  HENT:     Head: Normocephalic.  Eyes:     Conjunctiva/sclera: Conjunctivae normal.  Cardiovascular:     Rate and Rhythm: Normal rate.  Pulmonary:     Effort: Pulmonary effort is normal. No respiratory distress.  Neurological:     General: No focal deficit present.     Mental Status: She is alert and oriented to person, place, and time.  Psychiatric:        Mood and Affect: Mood normal.        Behavior: Behavior normal.     Results for orders placed or performed in visit on 02/16/23  POCT glycosylated hemoglobin (Hb A1C)  Result Value Ref Range   Hemoglobin A1C 5.5 4.0 - 5.6 %   HbA1c POC (<> result, manual entry)     HbA1c, POC (prediabetic range)     HbA1c, POC (controlled diabetic range)      Assessment & Plan     Problem List Items Addressed This Visit       Endocrine   Type 2 diabetes mellitus without complication, without long-term current use of insulin - Primary    Managed with metformin 500 mg bid, farxiga 5 mg and ozempic 2 mg.  Pt interested in switching to mounjaro A1c today stable at 5.5%.  Switching to mounjaro 7.5 mg. Advised if any additional SE -- hypoglycemia, nausea, vomiting to let us know Weight today 264 lbs.      Relevant Medications   tirzepatide (MOUNJARO) 7.5 MG/0.5ML Pen   Other Relevant Orders   POCT glycosylated hemoglobin (Hb A1C) (Completed)     Return in about 4 months (around 06/18/2023) for DMII, weight  Management.      I, Alfredia Ferguson, PA-C have reviewed all documentation for this visit. The documentation on  02/16/23  for the exam, diagnosis, procedures, and orders are all accurate and complete.  Alfredia Ferguson, PA-C [facility] [address] [place], [place], [address] Office: [phone] Fax: [phone]   [facility]

## 2023-02-19 ENCOUNTER — Other Ambulatory Visit: Payer: Self-pay

## 2023-02-19 ENCOUNTER — Encounter: Payer: Self-pay | Admitting: Physician Assistant

## 2023-02-19 NOTE — Assessment & Plan Note (Signed)
Managed with metformin 500 mg bid, farxiga 5 mg and ozempic 2 mg.  Pt interested in switching to mounjaro A1c today stable at 5.5%.  Switching to mounjaro 7.5 mg. Advised if any additional SE -- hypoglycemia, nausea, vomiting to let us know Weight today 264 lbs.

## 2023-03-08 ENCOUNTER — Other Ambulatory Visit: Payer: Self-pay | Admitting: Nurse Practitioner

## 2023-03-08 ENCOUNTER — Other Ambulatory Visit: Payer: Self-pay

## 2023-03-08 DIAGNOSIS — I10 Essential (primary) hypertension: Secondary | ICD-10-CM

## 2023-03-09 ENCOUNTER — Other Ambulatory Visit: Payer: Self-pay

## 2023-03-09 MED ORDER — AMLODIPINE BESYLATE 10 MG PO TABS
10.0000 mg | ORAL_TABLET | Freq: Every day | ORAL | 2 refills | Status: DC
  Filled 2023-03-09: qty 90, 90d supply, fill #0
  Filled 2023-06-23: qty 90, 90d supply, fill #1
  Filled 2023-08-05 – 2023-09-24 (×2): qty 90, 90d supply, fill #2

## 2023-03-15 ENCOUNTER — Other Ambulatory Visit: Payer: Self-pay

## 2023-03-15 ENCOUNTER — Encounter: Payer: Self-pay | Admitting: Physician Assistant

## 2023-03-24 ENCOUNTER — Other Ambulatory Visit: Payer: Self-pay | Admitting: Physician Assistant

## 2023-03-24 ENCOUNTER — Other Ambulatory Visit: Payer: Self-pay

## 2023-03-24 MED ORDER — DAPAGLIFLOZIN PROPANEDIOL 5 MG PO TABS
5.0000 mg | ORAL_TABLET | Freq: Every day | ORAL | 1 refills | Status: DC
  Filled 2023-03-24: qty 90, 90d supply, fill #0

## 2023-03-31 ENCOUNTER — Ambulatory Visit: Payer: Medicaid Other | Admitting: Student in an Organized Health Care Education/Training Program

## 2023-04-02 ENCOUNTER — Encounter: Payer: Self-pay | Admitting: Physician Assistant

## 2023-04-02 ENCOUNTER — Other Ambulatory Visit: Payer: Self-pay | Admitting: Physician Assistant

## 2023-04-02 ENCOUNTER — Other Ambulatory Visit: Payer: Self-pay

## 2023-04-02 DIAGNOSIS — E119 Type 2 diabetes mellitus without complications: Secondary | ICD-10-CM

## 2023-04-02 MED ORDER — TIRZEPATIDE 10 MG/0.5ML ~~LOC~~ SOAJ
10.0000 mg | SUBCUTANEOUS | 1 refills | Status: DC
  Filled 2023-04-02: qty 2, 28d supply, fill #0
  Filled 2023-05-02: qty 2, 28d supply, fill #1

## 2023-04-05 ENCOUNTER — Other Ambulatory Visit: Payer: Self-pay

## 2023-04-09 ENCOUNTER — Other Ambulatory Visit: Payer: Self-pay

## 2023-04-13 ENCOUNTER — Ambulatory Visit
Payer: 59 | Attending: Student in an Organized Health Care Education/Training Program | Admitting: Student in an Organized Health Care Education/Training Program

## 2023-04-13 ENCOUNTER — Encounter: Payer: Self-pay | Admitting: Student in an Organized Health Care Education/Training Program

## 2023-04-13 VITALS — BP 141/80 | HR 84 | Temp 98.4°F | Resp 16 | Ht 68.0 in | Wt 252.0 lb

## 2023-04-13 DIAGNOSIS — M5416 Radiculopathy, lumbar region: Secondary | ICD-10-CM

## 2023-04-13 DIAGNOSIS — Z9889 Other specified postprocedural states: Secondary | ICD-10-CM | POA: Diagnosis not present

## 2023-04-13 DIAGNOSIS — G8929 Other chronic pain: Secondary | ICD-10-CM

## 2023-04-13 NOTE — Progress Notes (Signed)
Patient: Denise Velez  Service Category: E/M  Provider: Edward Jolly, MD  DOB: Jan 17, 1966  DOS: 04/13/2023  Referring Provider: Elijah Birk, MD  MRN: [id number]  Setting: Ambulatory outpatient  PCP: Alfredia Ferguson, PA-C  Type: New Patient  Specialty: Interventional Pain Management    Location: Office  Delivery: Face-to-face     Primary Reason(s) for Visit: Encounter for initial evaluation of one or more chronic problems (new to examiner) potentially causing chronic pain, and posing a threat to normal musculoskeletal function. (Level of risk: High) CC: Back Pain (Lumbar bilateral ) and Knee Pain (Right s/p meniscus repair. )  HPI  Denise Velez is a 57 y.o. year old, female patient, who comes for the first time to our practice referred by Filomena Jungling I, MD for our initial evaluation of her chronic pain. She has Morbid obesity with BMI of 40.0-44.9, adult (HCC); Encounter to establish care with new doctor; Type 2 diabetes mellitus without complication, without long-term current use of insulin (HCC); Hyperlipidemia associated with type 2 diabetes mellitus (HCC); Hypertension associated with diabetes (HCC); Dysuria; Vaginal dryness; Positive depression screening; Lumbar radiculopathy; and H/O right knee surgery (right knee meniscus surgery) on their problem list. Today she comes in for evaluation of her Back Pain (Lumbar bilateral ) and Knee Pain (Right s/p meniscus repair. )  Pain Assessment: Location: Lower, Left, Right Back Radiating: into both hips mainly on the right Onset: More than a month ago Duration: Chronic pain Quality: Discomfort, Constant, Numbness, Tingling Severity: 6 /10 (subjective, self-reported pain score)  Effect on ADL: sleep disruption, sitting,standing or walking aggravates the pain. when sitting too long legs feel like they are going to sleep Timing: Constant Modifying factors: tylenol and tylenol PM BP: (!) 141/80  HR: 84  Onset and Duration: Present longer  than 3 months Cause of pain:  happened at work when she fell and also suffered a torn meniscuc  Severity: Getting worse, NAS-11 at its worse: 10/10, NAS-11 at its best: 5/10, NAS-11 now: 6/10, and NAS-11 on the average: 7/10 Timing: During activity or exercise and After activity or exercise Aggravating Factors: Bending, Lifiting, and Squatting Alleviating Factors: Cold packs, Hot packs, Lying down, and Medications Associated Problems: Fatigue, Inability to control bladder (urine), Numbness, Spasms, Swelling, Tingling, and Pain that does not allow patient to sleep Quality of Pain: Aching, Constant, Disabling, Feeling of weight, Nagging, Pressure-like, Sharp, and Tender Previous Examinations or Tests: MRI scan Previous Treatments: Physical Therapy  Denise Velez is being evaluated for possible interventional pain management therapies for the treatment of her chronic pain.   Denise Velez is a pleasant 57 year old female who presents with a chief complaint of low back pain with radiation into bilateral hips and bilateral legs right greater than left in a dermatomal fashion.  This has been going on for over 1 year.  She had a injury at work which she believes brought up on her low back and right knee pain.  She had a right knee meniscus surgery which was related to a Workmen's Comp. case.  She has been evaluated by physical medicine and rehab, with Dr. Mariah Milling and a lumbar epidural steroid injection was recommended however the patient has a phobia of needles and she was advised to come here to do the procedure with IV sedation.  She has been on extra strength Tylenol as well as gabapentin with limited response.  She is a type II diabetic and has lost over 40 pounds with Ozempic over the last year.  She  is currently on Mounjaro.  She has done physical and aquatic therapy in the past with limited response for her pain however the aquatic therapy was beneficial for weight loss.  Meds   Current Outpatient Medications:     Accu-Chek Softclix Lancets lancets, Use to check fasting blood sugars daily at as needed throughout the day, Disp: 100 each, Rfl: 3   amLODipine (NORVASC) 10 MG tablet, Take 1 tablet (10 mg total) by mouth daily., Disp: 90 tablet, Rfl: 2   blood glucose meter kit and supplies KIT, Dispense based on patient and insurance preference. Check blood sugar twice a day, Disp: 1 each, Rfl: 0   Blood Glucose Monitoring Suppl (ACCU-CHEK GUIDE) w/Device KIT, Use to check fasting blood sugars daily at as needed throughout the day, Disp: 1 kit, Rfl: 0   Blood Pressure Monitoring (BLOOD PRESSURE CUFF) MISC, 1 each by Does not apply route as needed., Disp: 1 each, Rfl: 0   conjugated estrogens (PREMARIN) vaginal cream, Place 1/3 applicator every other night at bedtime, Disp: 60 g, Rfl: 12   dapagliflozin propanediol (FARXIGA) 5 MG TABS tablet, Take 1 tablet (5 mg total) by mouth daily., Disp: 90 tablet, Rfl: 1   glucose blood (ACCU-CHEK GUIDE) test strip, Use to check fasting blood sugars daily at as needed throughout the day, Disp: 100 each, Rfl: 4   hydrochlorothiazide (MICROZIDE) 12.5 MG capsule, Take 1 capsule (12.5 mg total) by mouth daily., Disp: 90 capsule, Rfl: 1   losartan (COZAAR) 100 MG tablet, Take 1 tablet (100 mg total) by mouth daily., Disp: 90 tablet, Rfl: 1   metFORMIN (GLUCOPHAGE) 500 MG tablet, Take 1 tablet (500 mg total) by mouth 2 (two) times daily with a meal., Disp: 180 tablet, Rfl: 1   rosuvastatin (CRESTOR) 10 MG tablet, Take 1 tablet (10 mg total) by mouth daily., Disp: 90 tablet, Rfl: 3   tirzepatide (MOUNJARO) 10 MG/0.5ML Pen, Inject 10 mg into the skin once a week., Disp: 6 mL, Rfl: 1   estradiol (ESTRACE VAGINAL) 0.1 MG/GM vaginal cream, Place 1 Applicatorful vaginally at bedtime. Use nightly for 3 weeks, then 3 times/week as needed, Disp: 42.5 g, Rfl: 11  Imaging Review   Narrative CLINICAL DATA:  Heavy lifting. Right shoulder pain 2 days ago that has worsened.  EXAM: RIGHT  SHOULDER - 2+ VIEW  COMPARISON:  None.  FINDINGS: No acute fracture. No dislocation.  Unremarkable soft tissues.  IMPRESSION: No acute bony pathology.   Electronically Signed By: Jolaine Click M.D. On: 06/15/2016 19:54   DG Knee Complete 4 Views Right  Narrative CLINICAL DATA:  Twisting injury of the right knee  EXAM: RIGHT KNEE - COMPLETE 4+ VIEW  COMPARISON:  None.  FINDINGS: Moderate suprapatellar joint effusion. Questionable deep lateral sulcus sign which could reflect an impaction or osteochondral type injury along the superior-most articular surface of the lateral femoral condyle. No other acute osseous abnormality is seen. Mild tricompartmental degenerative changes. Minimal stranding is seen anterior to the patellar tendon albeit without significant thickening or discontinuity possibly related to patellar enthesopathic changes with bidirectional anterior patellar spurs.  IMPRESSION: 1. Moderate suprapatellar joint effusion 2. Possible deep lateral sulcus sign which could reflect an impaction or osteochondral type injury along the superior-most articular surface of the lateral femoral condyle. 3. Mild tricompartmental degenerative changes. 4. Minimal stranding anterior to the patellar tendon albeit without significant thickening or discontinuity. Possibly related to enthesopathy along the extensor mechanism, correlate for point tenderness.   Electronically Signed By: Samuella Cota  [facility] M.D. On: 10/03/2020 20:53  MRI lumbar spine 09/2021 done at [facility]: L4-5 facet arthropathy; L5-S1 shallow left disc bulge abutting left S1 nerve; facet arthropathy   Complexity Note: Imaging results reviewed.                         ROS  Cardiovascular: No reported cardiovascular signs or symptoms such as High blood pressure, coronary artery disease, abnormal heart rate or rhythm, heart attack, blood thinner therapy or heart weakness and/or failure Pulmonary or  Respiratory: No reported pulmonary signs or symptoms such as wheezing and difficulty taking a deep full breath (Asthma), difficulty blowing air out (Emphysema), coughing up mucus (Bronchitis), persistent dry cough, or temporary stoppage of breathing during sleep Neurological: No reported neurological signs or symptoms such as seizures, abnormal skin sensations, urinary and/or fecal incontinence, being born with an abnormal open spine and/or a tethered spinal cord Psychological-Psychiatric: No reported psychological or psychiatric signs or symptoms such as difficulty sleeping, anxiety, depression, delusions or hallucinations (schizophrenial), mood swings (bipolar disorders) or suicidal ideations or attempts Gastrointestinal: No reported gastrointestinal signs or symptoms such as vomiting or evacuating blood, reflux, heartburn, alternating episodes of diarrhea and constipation, inflamed or scarred liver, or pancreas or irrregular and/or infrequent bowel movements Genitourinary: No reported renal or genitourinary signs or symptoms such as difficulty voiding or producing urine, peeing blood, non-functioning kidney, kidney stones, difficulty emptying the bladder, difficulty controlling the flow of urine, or chronic kidney disease Hematological: No reported hematological signs or symptoms such as prolonged bleeding, low or poor functioning platelets, bruising or bleeding easily, hereditary bleeding problems, low energy levels due to low hemoglobin or being anemic Endocrine: No reported endocrine signs or symptoms such as high or low blood sugar, rapid heart rate due to high thyroid levels, obesity or weight gain due to slow thyroid or thyroid disease Rheumatologic: No reported rheumatological signs and symptoms such as fatigue, joint pain, tenderness, swelling, redness, heat, stiffness, decreased range of motion, with or without associated rash Musculoskeletal: Negative for myasthenia gravis, muscular dystrophy,  multiple sclerosis or malignant hyperthermia Work History: Legally disabled  Allergies  Denise Velez is allergic to sulfa antibiotics.  Laboratory Chemistry Profile   Renal Lab Results  Component Value Date   BUN 12 09/25/2022   CREATININE 0.75 09/25/2022   BCR 16 09/25/2022   GFRAA >60 05/13/2019   GFRNONAA >60 05/06/2021   PROTEINUR Negative 09/17/2014     Electrolytes Lab Results  Component Value Date   NA 141 09/25/2022   K 4.4 09/25/2022   CL 100 09/25/2022   CALCIUM 9.9 09/25/2022     Hepatic Lab Results  Component Value Date   AST 17 09/25/2022   ALT 18 09/25/2022   ALBUMIN 4.4 09/25/2022   ALKPHOS 123 (H) 09/25/2022     ID Lab Results  Component Value Date   SARSCOV2NAA Not Detected 08/12/2019   PREGTESTUR NEGATIVE 05/08/2021     Bone No results found for: "VD25OH", "VD125OH2TOT", "ZO1096EA5", "WU9811BJ4", "25OHVITD1", "25OHVITD2", "25OHVITD3", "TESTOFREE", "TESTOSTERONE"   Endocrine Lab Results  Component Value Date   GLUCOSE 77 09/25/2022   GLUCOSEU Negative 09/17/2014   HGBA1C 5.5 02/16/2023   TSH 1.360 07/21/2022     Neuropathy Lab Results  Component Value Date   HGBA1C 5.5 02/16/2023     CNS No results found for: "COLORCSF", "APPEARCSF", "RBCCOUNTCSF", "WBCCSF", "POLYSCSF", "LYMPHSCSF", "EOSCSF", "PROTEINCSF", "GLUCCSF", "JCVIRUS", "CSFOLI", "IGGCSF", "LABACHR", "ACETBL"   Inflammation (CRP: Acute  ESR: Chronic) No results found  for: "CRP", "ESRSEDRATE", "LATICACIDVEN"   Rheumatology No results found for: "RF", "ANA", "LABURIC", "URICUR", "LYMEIGGIGMAB", "LYMEABIGMQN", "HLAB27"   Coagulation Lab Results  Component Value Date   INR 1.0 05/06/2021   LABPROT 13.2 05/06/2021   APTT 40 (H) 05/06/2021   PLT 240 01/15/2022     Cardiovascular Lab Results  Component Value Date   HGB 13.8 01/15/2022   HCT 41.1 01/15/2022     Screening Lab Results  Component Value Date   SARSCOV2NAA Not Detected 08/12/2019   PREGTESTUR NEGATIVE  05/08/2021     Cancer No results found for: "CEA", "CA125", "LABCA2"   Allergens No results found for: "ALMOND", "APPLE", "ASPARAGUS", "AVOCADO", "BANANA", "BARLEY", "BASIL", "BAYLEAF", "GREENBEAN", "LIMABEAN", "WHITEBEAN", "BEEFIGE", "REDBEET", "BLUEBERRY", "BROCCOLI", "CABBAGE", "MELON", "CARROT", "CASEIN", "CASHEWNUT", "CAULIFLOWER", "CELERY"     Note: Lab results reviewed.  PFSH  Drug: Denise Velez  reports that she does not currently use drugs after having used the following drugs: Marijuana. Alcohol:  reports that she does not currently use alcohol. Tobacco:  reports that she quit smoking about 14 months ago. Her smoking use included cigarettes. She has a 2.50 pack-year smoking history. She has never used smokeless tobacco. Medical:  has a past medical history of Anxiety (03/2022), Arthritis, Depression (03/2022), Diabetes mellitus without complication (HCC), Hypertension, and Osteoporosis. Family: family history includes Arthritis in her mother; Diabetes in her brother, daughter, and mother; Hypertension in her brother, daughter, and mother; Kidney disease in her mother; Obesity in her daughter; Stroke in her mother.  Past Surgical History:  Procedure Laterality Date   BREAST BIOPSY Left 01/20/2023   stereo bx, calcs, "X" clip-path pending   BREAST BIOPSY Left 01/20/2023   stereo bx, calcs, ribbon clip-path pending   BREAST BIOPSY Left 01/20/2023   MM LT BREAST BX W LOC DEV 1ST LESION IMAGE BX SPEC STEREO GUIDE 01/20/2023 [facility]   BREAST BIOPSY Left 01/20/2023   MM LT BREAST BX W LOC DEV EA AD LESION IMG BX SPEC STEREO GUIDE 01/20/2023 [facility]   CESAREAN SECTION     times 2   KNEE ARTHROSCOPY WITH MEDIAL MENISECTOMY Right 05/08/2021   Procedure: RIGHT KNEE ARTHROSCOPY WITH MEDIAL MENISECTOMY;  Surgeon: Juanell Fairly, MD;  Location: [facility];  Service: Orthopedics;  Laterality: Right;   TUBAL LIGATION  04/1993   Active Ambulatory Problems    Diagnosis Date Noted    Morbid obesity with BMI of 40.0-44.9, adult (HCC) 01/15/2022   Encounter to establish care with new doctor 01/15/2022   Type 2 diabetes mellitus without complication, without long-term current use of insulin (HCC) 01/25/2022   Hyperlipidemia associated with type 2 diabetes mellitus (HCC) 01/25/2022   Hypertension associated with diabetes (HCC) 01/25/2022   Dysuria 05/26/2022   Vaginal dryness 05/26/2022   Positive depression screening 09/25/2022   Lumbar radiculopathy 04/13/2023   H/O right knee surgery (right knee meniscus surgery) 04/13/2023   Resolved Ambulatory Problems    Diagnosis Date Noted   Smoker 01/15/2022   Past Medical History:  Diagnosis Date   Anxiety 03/2022   Arthritis    Depression 03/2022   Diabetes mellitus without complication (HCC)    Hypertension    Osteoporosis    Constitutional Exam  General appearance: Well nourished, well developed, and well hydrated. In no apparent acute distress Vitals:   04/13/23 1342  BP: (!) 141/80  Pulse: 84  Resp: 16  Temp: 98.4 F (36.9 C)  TempSrc: Temporal  SpO2: 100%  Weight: 252 lb (114.3 kg)  Height: 5\' 8"  (1.727 m)  BMI Assessment: Estimated body mass index is 38.32 kg/m as calculated from the following:   Height as of this encounter: 5\' 8"  (1.727 m).   Weight as of this encounter: 252 lb (114.3 kg).  BMI interpretation table: BMI level Category Range association with higher incidence of chronic pain  <18 kg/m2 Underweight   18.5-24.9 kg/m2 Ideal body weight   25-29.9 kg/m2 Overweight Increased incidence by 20%  30-34.9 kg/m2 Obese (Class I) Increased incidence by 68%  35-39.9 kg/m2 Severe obesity (Class II) Increased incidence by 136%  >40 kg/m2 Extreme obesity (Class III) Increased incidence by 254%   Patient's current BMI Ideal Body weight  Body mass index is 38.32 kg/m. Ideal body weight: 63.9 kg (140 lb 14 oz) Adjusted ideal body weight: 84.1 kg (185 lb 5.2 oz)   BMI Readings from Last 4 Encounters:   04/13/23 38.32 kg/m  02/16/23 41.46 kg/m  09/25/22 38.77 kg/m  08/26/22 39.47 kg/m   Wt Readings from Last 4 Encounters:  04/13/23 252 lb (114.3 kg)  02/16/23 264 lb 11.2 oz (120.1 kg)  09/25/22 255 lb (115.7 kg)  08/26/22 252 lb (114.3 kg)    Psych/Mental status: Alert, oriented x 3 (person, place, & time)       Eyes: PERLA Respiratory: No evidence of acute respiratory distress  Lumbar Spine Area Exam  Skin & Axial Inspection: No masses, redness, or swelling Alignment: Symmetrical Functional ROM: Pain restricted ROM affecting both sides Stability: No instability detected Muscle Tone/Strength: Functionally intact. No obvious neuro-muscular anomalies detected. Sensory (Neurological): Dermatomal pain pattern Palpation: No palpable anomalies       Provocative Tests: Hyperextension/rotation test: (+) due to pain. Lumbar quadrant test (Kemp's test): (+) bilateral for foraminal stenosis Lateral bending test: (+) ipsilateral radicular pain, bilaterally. Positive for bilateral foraminal stenosis.  Gait & Posture Assessment  Ambulation: Unassisted Gait: Relatively normal for age and body habitus Posture: WNL  Lower Extremity Exam    Side: Right lower extremity  Side: Left lower extremity  Stability: No instability observed          Stability: No instability observed          Skin & Extremity Inspection: Skin color, temperature, and hair growth are WNL. No peripheral edema or cyanosis. No masses, redness, swelling, asymmetry, or associated skin lesions. No contractures.  Skin & Extremity Inspection: Skin color, temperature, and hair growth are WNL. No peripheral edema or cyanosis. No masses, redness, swelling, asymmetry, or associated skin lesions. No contractures.  Functional ROM: Unrestricted ROM                  Functional ROM: Unrestricted ROM                  Muscle Tone/Strength: Functionally intact. No obvious neuro-muscular anomalies detected.  Muscle Tone/Strength:  Functionally intact. No obvious neuro-muscular anomalies detected.  Sensory (Neurological): Unimpaired        Sensory (Neurological): Unimpaired        DTR: Patellar: deferred today Achilles: deferred today Plantar: deferred today  DTR: Patellar: deferred today Achilles: deferred today Plantar: deferred today  Palpation: No palpable anomalies  Palpation: No palpable anomalies    Assessment  Primary Diagnosis & Pertinent Problem List: The primary encounter diagnosis was Chronic radicular lumbar pain. Diagnoses of Lumbar radiculopathy and H/O right knee surgery (right knee meniscus surgery) were also pertinent to this visit.  Visit Diagnosis (New problems to examiner): 1. Chronic radicular lumbar pain   2. Lumbar radiculopathy   3.  H/O right knee surgery (right knee meniscus surgery)    Plan of Care (Initial workup plan)    Procedure Orders         Lumbar Epidural Injection       Interventional management options: Denise Velez was informed that there is no guarantee that she would be a candidate for interventional therapies. The decision will be based on the results of diagnostic studies, as well as Denise Velez's risk profile.  Procedure(s) under consideration:  Right knee genicular NB     Provider-requested follow-up: Return in about 15 days (around 04/28/2023) for L4/5 (or L5/S1) ESI, in clinic IV Versed.  No future appointments.  Duration of encounter: .  Total time on encounter, as per AMA guidelines included both the face-to-face and non-face-to-face time personally spent by the physician and/or other qualified health care professional(s) on the day of the encounter (includes time in activities that require the physician or other qualified health care professional and does not include time in activities normally performed by clinical staff). Physician's time may include the following activities when performed: Preparing to see the patient (e.g., pre-charting review of  records, searching for previously ordered imaging, lab work, and nerve conduction tests) Review of prior analgesic pharmacotherapies. Reviewing PMP Interpreting ordered tests (e.g., lab work, imaging, nerve conduction tests) Performing post-procedure evaluations, including interpretation of diagnostic procedures Obtaining and/or reviewing separately obtained history Performing a medically appropriate examination and/or evaluation Counseling and educating the patient/family/caregiver Ordering medications, tests, or procedures Referring and communicating with other health care professionals (when not separately reported) Documenting clinical information in the electronic or other health record Independently interpreting results (not separately reported) and communicating results to the patient/ family/caregiver Care coordination (not separately reported)  Note by: Edward Jolly, MD (TTS technology used. I apologize for any typographical errors that were not detected and corrected.) Date: 04/13/2023; Time: 3:02 PM

## 2023-04-13 NOTE — Progress Notes (Signed)
Safety precautions to be maintained throughout the outpatient stay will include: orient to surroundings, keep bed in low position, maintain call bell within reach at all times, provide assistance with transfer out of bed and ambulation.  

## 2023-04-13 NOTE — Patient Instructions (Signed)
Procedure instructions  Do not eat or drink fluids (other than water) for 6 hours before your procedure  No water for 2 hours before your procedure  Take your blood pressure medicine with a sip of water  Arrive 30 minutes before your appointment  Carefully read the "Preparing for your procedure" detailed instructions  If you have questions call us at [phone]  _____________________________________________________________________    ______________________________________________________________________  Preparing for your procedure  Appointments: If you think you may not be able to keep your appointment, call 24-48 hours in advance to cancel. We need time to make it available to others.  During your procedure appointment there will be: No Prescription Refills. No disability issues to discussed. No medication changes or discussions.  Instructions: Food intake: Avoid eating anything solid for at least 8 hours prior to your procedure. Clear liquid intake: You may take clear liquids such as water up to 2 hours prior to your procedure. (No carbonated drinks. No soda.) Transportation: Unless otherwise stated by your physician, bring a driver. Morning Medicines: Except for blood thinners, take all of your other morning medications with a sip of water. Make sure to take your heart and blood pressure medicines. If your blood pressure's lower number is above 100, the case will be rescheduled. Blood thinners: Make sure to stop your blood thinners as instructed.  If you take a blood thinner, but were not instructed to stop it, call our office [phone] and ask to talk to a nurse. Not stopping a blood thinner prior to certain procedures could lead to serious complications. Diabetics on insulin: Notify the staff so that you can be scheduled 1st case in the morning. If your diabetes requires high dose insulin, take only  of your normal insulin dose the morning of the procedure and  notify the staff that you have done so. Preventing infections: Shower with an antibacterial soap the morning of your procedure.  Build-up your immune system: Take 1000 mg of Vitamin C with every meal (3 times a day) the day prior to your procedure. Antibiotics: Inform the nursing staff if you are taking any antibiotics or if you have any conditions that may require antibiotics prior to procedures. (Example: recent joint implants)   Pregnancy: If you are pregnant make sure to notify the nursing staff. Not doing so may result in injury to the fetus, including death.  Sickness: If you have a cold, fever, or any active infections, call and cancel or reschedule your procedure. Receiving steroids while having an infection may result in complications. Arrival: You must be in the facility at least 30 minutes prior to your scheduled procedure. Tardiness: Your scheduled time is also the cutoff time. If you do not arrive at least 15 minutes prior to your procedure, you will be rescheduled.  Children: Do not bring any children with you. Make arrangements to keep them home. Dress appropriately: There is always a possibility that your clothing may get soiled. Avoid long dresses. Valuables: Do not bring any jewelry or valuables.  Reasons to call and reschedule or cancel your procedure: (Following these recommendations will minimize the risk of a serious complication.) Surgeries: Avoid having procedures within 2 weeks of any surgery. (Avoid for 2 weeks before or after any surgery). Flu Shots: Avoid having procedures within 2 weeks of a flu shots or . (Avoid for 2 weeks before or after immunizations). Barium: Avoid having a procedure within 7-10 days after having had a radiological study involving the use of radiological contrast. (  Myelograms, Barium swallow or enema study). Heart attacks: Avoid any elective procedures or surgeries for the initial 6 months after a "Myocardial Infarction" (Heart Attack). Blood  thinners: It is imperative that you stop these medications before procedures. Let us know if you if you take any blood thinner.  Infection: Avoid procedures during or within two weeks of an infection (including chest colds or gastrointestinal problems). Symptoms associated with infections include: Localized redness, fever, chills, night sweats or profuse sweating, burning sensation when voiding, cough, congestion, stuffiness, runny nose, sore throat, diarrhea, nausea, vomiting, cold or Flu symptoms, recent or current infections. It is specially important if the infection is over the area that we intend to treat. Heart and lung problems: Symptoms that may suggest an active cardiopulmonary problem include: cough, chest pain, breathing difficulties or shortness of breath, dizziness, ankle swelling, uncontrolled high or unusually low blood pressure, and/or palpitations. If you are experiencing any of these symptoms, cancel your procedure and contact your primary care physician for an evaluation.  Remember:  Regular Business hours are:  Monday to Thursday 8:00 AM to 4:00 PM  Provider's Schedule: Francisco Naveira, MD:  Procedure days: Tuesday and Thursday 7:30 AM to 4:00 PM  Bilal Lateef, MD:  Procedure days: Monday and Wednesday 7:30 AM to 4:00 PM  Epidural Steroid Injection Patient Information  Description: The epidural space surrounds the nerves as they exit the spinal cord.  In some patients, the nerves can be compressed and inflamed by a bulging disc or a tight spinal canal (spinal stenosis).  By injecting steroids into the epidural space, we can bring irritated nerves into direct contact with a potentially helpful medication.  These steroids act directly on the irritated nerves and can reduce swelling and inflammation which often leads to decreased pain.  Epidural steroids may be injected anywhere along the spine and from the neck to the low back depending upon the location of your pain.   After  numbing the skin with local anesthetic (like Novocaine), a small needle is passed into the epidural space slowly.  You may experience a sensation of pressure while this is being done.  The entire block usually last less than 10 minutes.  Conditions which may be treated by epidural steroids:  Low back and leg pain Neck and arm pain Spinal stenosis Post-laminectomy syndrome Herpes zoster (shingles) pain Pain from compression fractures  Preparation for the injection:  Do not eat any solid food or dairy products within 8 hours of your appointment.  You may drink clear liquids up to 3 hours before appointment.  Clear liquids include water, black coffee, juice or soda.  No milk or cream please. You may take your regular medication, including pain medications, with a sip of water before your appointment  Diabetics should hold regular insulin (if taken separately) and take 1/2 normal NPH dos the morning of the procedure.  Carry some sugar containing items with you to your appointment. A driver must accompany you and be prepared to drive you home after your procedure.  Bring all your current medications with your. An IV may be inserted and sedation may be given at the discretion of the physician.   A blood pressure cuff, EKG and other monitors will often be applied during the procedure.  Some patients may need to have extra oxygen administered for a short period. You will be asked to provide medical information, including your allergies, prior to the procedure.  We must know immediately if you are taking blood thinners (like   Coumadin/Warfarin)  Or if you are allergic to IV iodine contrast (dye). We must know if you could possible be pregnant.  Possible side-effects: Bleeding from needle site Infection (rare, may require surgery) Nerve injury (rare) Numbness & tingling (temporary) Difficulty urinating (rare, temporary) Spinal headache ( a headache worse with upright posture) Light -headedness  (temporary) Pain at injection site (several days) Decreased blood pressure (temporary) Weakness in arm/leg (temporary) Pressure sensation in back/neck (temporary)  Call if you experience: Fever/chills associated with headache or increased back/neck pain. Headache worsened by an upright position. New onset weakness or numbness of an extremity below the injection site Hives or difficulty breathing (go to the emergency room) Inflammation or drainage at the infection site Severe back/neck pain Any new symptoms which are concerning to you  Please note:  Although the local anesthetic injected can often make your back or neck feel good for several hours after the injection, the pain will likely return.  It takes 3-7 days for steroids to work in the epidural space.  You may not notice any pain relief for at least that one week.  If effective, we will often do a series of three injections spaced 3-6 weeks apart to maximally decrease your pain.  After the initial series, we generally will wait several months before considering a repeat injection of the same type.  If you have any questions, please call [phone] [facility] 

## 2023-04-15 ENCOUNTER — Telehealth: Payer: Self-pay

## 2023-04-15 DIAGNOSIS — Z01419 Encounter for gynecological examination (general) (routine) without abnormal findings: Secondary | ICD-10-CM

## 2023-04-16 MED ORDER — PREMARIN 0.625 MG/GM VA CREA: 1.0000 | TOPICAL_CREAM | VAGINAL | 12 refills | Status: DC

## 2023-04-16 NOTE — Telephone Encounter (Signed)
Rx sent to pharmacy   

## 2023-04-26 ENCOUNTER — Telehealth: Payer: Self-pay | Admitting: Student in an Organized Health Care Education/Training Program

## 2023-04-26 NOTE — Telephone Encounter (Signed)
error 

## 2023-05-03 ENCOUNTER — Other Ambulatory Visit: Payer: Self-pay

## 2023-05-03 ENCOUNTER — Ambulatory Visit
Payer: 59 | Attending: Student in an Organized Health Care Education/Training Program | Admitting: Student in an Organized Health Care Education/Training Program

## 2023-05-03 ENCOUNTER — Telehealth: Payer: Self-pay | Admitting: Physician Assistant

## 2023-05-03 ENCOUNTER — Ambulatory Visit
Admission: RE | Admit: 2023-05-03 | Discharge: 2023-05-03 | Disposition: A | Discharge status: Home / Self Care | Payer: 59 | Source: Ambulatory Visit | Attending: Student in an Organized Health Care Education/Training Program | Admitting: Student in an Organized Health Care Education/Training Program

## 2023-05-03 ENCOUNTER — Encounter: Payer: Self-pay | Admitting: Student in an Organized Health Care Education/Training Program

## 2023-05-03 DIAGNOSIS — E119 Type 2 diabetes mellitus without complications: Secondary | ICD-10-CM

## 2023-05-03 DIAGNOSIS — M5416 Radiculopathy, lumbar region: Secondary | ICD-10-CM | POA: Insufficient documentation

## 2023-05-03 DIAGNOSIS — G8929 Other chronic pain: Secondary | ICD-10-CM | POA: Diagnosis present

## 2023-05-03 MED ORDER — LIDOCAINE HCL 2 % IJ SOLN
20.0000 mL | Freq: Once | INTRAMUSCULAR | Status: AC
  Administered 2023-05-03: 400 mg
  Filled 2023-05-03: qty 20

## 2023-05-03 MED ORDER — LACTATED RINGERS IV SOLN: Freq: Once | INTRAVENOUS | Status: AC

## 2023-05-03 MED ORDER — DEXAMETHASONE SODIUM PHOSPHATE 10 MG/ML IJ SOLN
10.0000 mg | Freq: Once | INTRAMUSCULAR | Status: AC
  Administered 2023-05-03: 10 mg
  Filled 2023-05-03: qty 1

## 2023-05-03 MED ORDER — MIDAZOLAM HCL 2 MG/2ML IJ SOLN
0.5000 mg | Freq: Once | INTRAMUSCULAR | Status: AC
  Administered 2023-05-03: 2 mg via INTRAVENOUS
  Filled 2023-05-03: qty 2

## 2023-05-03 MED ORDER — ROPIVACAINE HCL 2 MG/ML IJ SOLN
2.0000 mL | Freq: Once | INTRAMUSCULAR | Status: AC
  Administered 2023-05-03: 2 mL via EPIDURAL
  Filled 2023-05-03: qty 20

## 2023-05-03 MED ORDER — SODIUM CHLORIDE 0.9% FLUSH
2.0000 mL | Freq: Once | INTRAVENOUS | Status: AC
  Administered 2023-05-03: 2 mL

## 2023-05-03 MED ORDER — IOHEXOL 180 MG/ML  SOLN
10.0000 mL | Freq: Once | INTRAMUSCULAR | Status: AC
  Administered 2023-05-03: 10 mL via EPIDURAL
  Filled 2023-05-03: qty 20

## 2023-05-03 NOTE — Patient Instructions (Signed)

## 2023-05-03 NOTE — Telephone Encounter (Signed)
[facility] faxed refill request for the following medications:   metFORMIN (GLUCOPHAGE) 500 MG tablet    dapagliflozin propanediol (FARXIGA) 5 MG TABS tablet    tirzepatide (MOUNJARO) 10 MG/0.5ML Pen    Please advise

## 2023-05-03 NOTE — Progress Notes (Signed)
PROVIDER NOTE: Interpretation of information contained herein should be left to medically-trained personnel. Specific patient instructions are provided elsewhere under "Patient Instructions" section of medical record. This document was created in part using STT-dictation technology, any transcriptional errors that may result from this process are unintentional.  Patient: Denise Velez Type: Established DOB: September 30, 1966 MRN: [id number] PCP: Alfredia Ferguson, PA-C  Service: Procedure DOS: 05/03/2023 Setting: Ambulatory Location: Ambulatory outpatient facility Delivery: Face-to-face Provider: Edward Jolly, MD Specialty: Interventional Pain Management Specialty designation: 09 Location: Outpatient facility Ref. Prov.: Edward Jolly, MD       Interventional Therapy   Procedure: Lumbar epidural steroid injection (LESI) (interlaminar) #1    Laterality: Midline   Level:  L4-5 Level.  Imaging: Fluoroscopic guidance         Anesthesia: Local anesthesia (1-2% Lidocaine) Anxiolysis: IV Versed 2 mg DOS: 05/03/2023  Performed by: Edward Jolly, MD  Purpose: Diagnostic/Therapeutic Indications: Lumbar radicular pain of intraspinal etiology of more than 4 weeks that has failed to respond to conservative therapy and is severe enough to impact quality of life or function. 1. Chronic radicular lumbar pain   2. Lumbar radiculopathy    NAS-11 Pain score:   Pre-procedure: 7 /10   Post-procedure: 0-No pain/10      Position / Prep / Materials:  Position: Prone w/ head of the table raised (slight reverse trendelenburg) to facilitate breathing.  Prep solution: DuraPrep (Iodine Povacrylex [0.7% available iodine] and Isopropyl Alcohol, 74% w/w) Prep Area: Entire Posterior Lumbar Region from lower scapular tip down to mid buttocks area and from flank to flank. Materials:  Tray: Epidural tray Needle(s):  Type: Epidural needle (Tuohy) Gauge (G):  17 Length: Long (20cm) Qty: 1   Pre-op H&P Assessment:   Ms. Dalomba is a 57 y.o. (year old), female patient, seen today for interventional treatment. She  has a past surgical history that includes Cesarean section; Knee arthroscopy with medial menisectomy (Right, 05/08/2021); Tubal ligation (04/1993); Breast biopsy (Left, 01/20/2023); Breast biopsy (Left, 01/20/2023); Breast biopsy (Left, 01/20/2023); and Breast biopsy (Left, 01/20/2023). Ms. Garin has a current medication list which includes the following prescription(s): accu-chek softclix lancets, amlodipine, blood glucose meter kit and supplies, accu-chek guide, blood pressure cuff, premarin, dapagliflozin propanediol, accu-chek guide, hydrochlorothiazide, losartan, metformin, rosuvastatin, tirzepatide, and estradiol, and the following Facility-Administered Medications: lactated ringers. Her primarily concern today is the Back Pain  Initial Vital Signs:  Pulse/HCG Rate: 100  Temp: 97.9 F (36.6 C) Resp: 17 BP: (!) 151/66 SpO2: 100 %  BMI: Estimated body mass index is 39.84 kg/m as calculated from the following:   Height as of this encounter: 5\' 8"  (1.727 m).   Weight as of this encounter: 262 lb (118.8 kg).  Risk Assessment: Allergies: Reviewed. She is allergic to sulfa antibiotics.  Allergy Precautions: None required Coagulopathies: Reviewed. None identified.  Blood-thinner therapy: None at this time Active Infection(s): Reviewed. None identified. Ms. Corban is afebrile  Site Confirmation: Ms. Coville was asked to confirm the procedure and laterality before marking the site Procedure checklist: Completed Consent: Before the procedure and under the influence of no sedative(s), amnesic(s), or anxiolytics, the patient was informed of the treatment options, risks and possible complications. To fulfill our ethical and legal obligations, as recommended by the American Medical Association's Code of Ethics, I have informed the patient of my clinical impression; the nature and purpose of the treatment or  procedure; the risks, benefits, and possible complications of the intervention; the alternatives, including doing nothing; the risk(s) and benefit(s) of  the alternative treatment(s) or procedure(s); and the risk(s) and benefit(s) of doing nothing. The patient was provided information about the general risks and possible complications associated with the procedure. These may include, but are not limited to: failure to achieve desired goals, infection, bleeding, organ or nerve damage, allergic reactions, paralysis, and death. In addition, the patient was informed of those risks and complications associated to Spine-related procedures, such as failure to decrease pain; infection (i.e.: Meningitis, epidural or intraspinal abscess); bleeding (i.e.: epidural hematoma, subarachnoid hemorrhage, or any other type of intraspinal or peri-dural bleeding); organ or nerve damage (i.e.: Any type of peripheral nerve, nerve root, or spinal cord injury) with subsequent damage to sensory, motor, and/or autonomic systems, resulting in permanent pain, numbness, and/or weakness of one or several areas of the body; allergic reactions; (i.e.: anaphylactic reaction); and/or death. Furthermore, the patient was informed of those risks and complications associated with the medications. These include, but are not limited to: allergic reactions (i.e.: anaphylactic or anaphylactoid reaction(s)); adrenal axis suppression; blood sugar elevation that in diabetics may result in ketoacidosis or comma; water retention that in patients with history of congestive heart failure may result in shortness of breath, pulmonary edema, and decompensation with resultant heart failure; weight gain; swelling or edema; medication-induced neural toxicity; particulate matter embolism and blood vessel occlusion with resultant organ, and/or nervous system infarction; and/or aseptic necrosis of one or more joints. Finally, the patient was informed that Medicine is  not an exact science; therefore, there is also the possibility of unforeseen or unpredictable risks and/or possible complications that may result in a catastrophic outcome. The patient indicated having understood very clearly. We have given the patient no guarantees and we have made no promises. Enough time was given to the patient to ask questions, all of which were answered to the patient's satisfaction. Ms. Leier has indicated that she wanted to continue with the procedure. Attestation: I, the ordering provider, attest that I have discussed with the patient the benefits, risks, side-effects, alternatives, likelihood of achieving goals, and potential problems during recovery for the procedure that I have provided informed consent. Date  Time: 05/03/2023 11:38 AM   Pre-Procedure Preparation:  Monitoring: As per clinic protocol. Respiration, ETCO2, SpO2, BP, heart rate and rhythm monitor placed and checked for adequate function Safety Precautions: Patient was assessed for positional comfort and pressure points before starting the procedure. Time-out: I initiated and conducted the "Time-out" before starting the procedure, as per protocol. The patient was asked to participate by confirming the accuracy of the "Time Out" information. Verification of the correct person, site, and procedure were performed and confirmed by me, the nursing staff, and the patient. "Time-out" conducted as per Joint Commission's Universal Protocol (UP.01.01.01). Time: 1220 Start Time: 1220 hrs.  Description/Narrative of Procedure:          Target: Epidural space via interlaminar opening, initially targeting the lower laminar border of the superior vertebral body. Region: Lumbar Approach: Percutaneous paravertebral  Rationale (medical necessity): procedure needed and proper for the diagnosis and/or treatment of the patient's medical symptoms and needs. Procedural Technique Safety Precautions: Aspiration looking for blood  return was conducted prior to all injections. At no point did we inject any substances, as a needle was being advanced. No attempts were made at seeking any paresthesias. Safe injection practices and needle disposal techniques used. Medications properly checked for expiration dates. SDV (single dose vial) medications used. Description of the Procedure: Protocol guidelines were followed. The procedure needle was introduced through the  skin, ipsilateral to the reported pain, and advanced to the target area. Bone was contacted and the needle walked caudad, until the lamina was cleared. The epidural space was identified using "loss-of-resistance technique" with 2-3 ml of PF-NaCl (0.9% NSS), in a 5cc LOR glass syringe.  5cc solution made of 2 cc of preservative-free saline, 2 cc of 0.2% ropivacaine, 1 cc of Decadron 10 mg/cc.   Vitals:   05/03/23 1219 05/03/23 1224 05/03/23 1226 05/03/23 1231  BP: (!) 153/91 (!) 153/89 (!) 154/90 (!) 121/48  Pulse: 96 100 99 90  Resp: (!) 22 (!) 21 20 18   Temp:      TempSrc:      SpO2: 99% 100% 99% 100%  Weight:      Height:        Start Time: 1220 hrs. End Time:   hrs.  Imaging Guidance (Spinal):          Type of Imaging Technique: Fluoroscopy Guidance (Spinal) Indication(s): Assistance in needle guidance and placement for procedures requiring needle placement in or near specific anatomical locations not easily accessible without such assistance. Exposure Time: Please see nurses notes. Contrast: Before injecting any contrast, we confirmed that the patient did not have an allergy to iodine, shellfish, or radiological contrast. Once satisfactory needle placement was completed at the desired level, radiological contrast was injected. Contrast injected under live fluoroscopy. No contrast complications. See chart for type and volume of contrast used. Fluoroscopic Guidance: I was personally present during the use of fluoroscopy. "Tunnel Vision Technique" used to  obtain the best possible view of the target area. Parallax error corrected before commencing the procedure. "Direction-depth-direction" technique used to introduce the needle under continuous pulsed fluoroscopy. Once target was reached, antero-posterior, oblique, and lateral fluoroscopic projection used confirm needle placement in all planes. Images permanently stored in EMR. Interpretation: I personally interpreted the imaging intraoperatively. Adequate needle placement confirmed in multiple planes. Appropriate spread of contrast into desired area was observed. No evidence of afferent or efferent intravascular uptake. No intrathecal or subarachnoid spread observed. Permanent images saved into the patient's record.  Antibiotic Prophylaxis:   Anti-infectives (From admission, onward)    None      Indication(s): None identified  Post-operative Assessment:  Post-procedure Vital Signs:  Pulse/HCG Rate: 90  Temp: 97.9 F (36.6 C) Resp: 18 BP: (!) 121/48 SpO2: 100 %  EBL: None  Complications: No immediate post-treatment complications observed by team, or reported by patient.  Note: The patient tolerated the entire procedure well. A repeat set of vitals were taken after the procedure and the patient was kept under observation following institutional policy, for this type of procedure. Post-procedural neurological assessment was performed, showing return to baseline, prior to discharge. The patient was provided with post-procedure discharge instructions, including a section on how to identify potential problems. Should any problems arise concerning this procedure, the patient was given instructions to immediately contact us, at any time, without hesitation. In any case, we plan to contact the patient by telephone for a follow-up status report regarding this interventional procedure.  Comments:  No additional relevant information.  Plan of Care (POC)  Orders:  Orders Placed This Encounter   Procedures   DG PAIN CLINIC C-ARM 1-60 MIN NO REPORT    Intraoperative interpretation by procedural physician at [facility].    Standing Status:   Standing    Number of Occurrences:   1    Order Specific Question:   Reason for exam:    Answer:  Assistance in needle guidance and placement for procedures requiring needle placement in or near specific anatomical locations not easily accessible without such assistance.    Medications ordered for procedure: Meds ordered this encounter  Medications   iohexol (OMNIPAQUE) 180 MG/ML injection 10 mL    Must be Myelogram-compatible. If not available, you may substitute with a water-soluble, non-ionic, hypoallergenic, myelogram-compatible radiological contrast medium.   lidocaine (XYLOCAINE) 2 % (with pres) injection 400 mg   lactated ringers infusion   midazolam (VERSED) injection 0.5-2 mg    Make sure Flumazenil is available in the pyxis when using this medication. If oversedation occurs, administer 0.2 mg IV over 15 sec. If after 45 sec no response, administer 0.2 mg again over 1 min; may repeat at 1 min intervals; not to exceed 4 doses (1 mg)   sodium chloride flush (NS) 0.9 % injection 2 mL   ropivacaine (PF) 2 mg/mL (0.2%) (NAROPIN) injection 2 mL   dexamethasone (DECADRON) injection 10 mg   Medications administered: We administered iohexol, lidocaine, lactated ringers, midazolam, sodium chloride flush, ropivacaine (PF) 2 mg/mL (0.2%), and dexamethasone.  See the medical record for exact dosing, route, and time of administration.  Follow-up plan:   Return in about 4 weeks (around 05/31/2023) for Post Procedure Evaluation, in person.      Recent Visits Date Type Provider Dept  04/13/23 Office Visit Edward Jolly, MD [facility]  Showing recent visits within past 90 days and meeting all other requirements Today's Visits Date Type Provider Dept  05/03/23 Procedure visit Edward Jolly, MD [facility]   Showing today's visits and meeting all other requirements Future Appointments Date Type Provider Dept  06/02/23 Appointment Edward Jolly, MD [facility]  Showing future appointments within next 90 days and meeting all other requirements  Disposition: Discharge home  Discharge (Date  Time): 05/03/2023; 1240 hrs.   Primary Care Physician: Alfredia Ferguson, PA-C Location: [facility] Note by: Edward Jolly, MD (TTS technology used. I apologize for any typographical errors that were not detected and corrected.) Date: 05/03/2023; Time: 12:45 PM  Disclaimer:  Medicine is not an Visual merchandiser. The only guarantee in medicine is that nothing is guaranteed. It is important to note that the decision to proceed with this intervention was based on the information collected from the patient. The Data and conclusions were drawn from the patient's questionnaire, the interview, and the physical examination. Because the information was provided in large part by the patient, it cannot be guaranteed that it has not been purposely or unconsciously manipulated. Every effort has been made to obtain as much relevant data as possible for this evaluation. It is important to note that the conclusions that lead to this procedure are derived in large part from the available data. Always take into account that the treatment will also be dependent on availability of resources and existing treatment guidelines, considered by other Pain Management Practitioners as being common knowledge and practice, at the time of the intervention. For Medico-Legal purposes, it is also important to point out that variation in procedural techniques and pharmacological choices are the acceptable norm. The indications, contraindications, technique, and results of the above procedure should only be interpreted and judged by a Board-Certified Interventional Pain Specialist with extensive familiarity and expertise in  the same exact procedure and technique.

## 2023-05-03 NOTE — Progress Notes (Signed)
Safety precautions to be maintained throughout the outpatient stay will include: orient to surroundings, keep bed in low position, maintain call bell within reach at all times, provide assistance with transfer out of bed and ambulation.  

## 2023-05-04 ENCOUNTER — Telehealth: Payer: Self-pay

## 2023-05-04 ENCOUNTER — Other Ambulatory Visit: Payer: Self-pay | Admitting: Physician Assistant

## 2023-05-04 ENCOUNTER — Encounter: Payer: Self-pay | Admitting: Student in an Organized Health Care Education/Training Program

## 2023-05-04 ENCOUNTER — Other Ambulatory Visit: Payer: Self-pay

## 2023-05-04 ENCOUNTER — Encounter: Payer: Self-pay | Admitting: Physician Assistant

## 2023-05-04 ENCOUNTER — Telehealth: Payer: Self-pay | Admitting: Student in an Organized Health Care Education/Training Program

## 2023-05-04 DIAGNOSIS — E119 Type 2 diabetes mellitus without complications: Secondary | ICD-10-CM

## 2023-05-04 MED ORDER — TIRZEPATIDE 10 MG/0.5ML ~~LOC~~ SOAJ
10.0000 mg | SUBCUTANEOUS | 1 refills | Status: DC
  Filled 2023-05-04: qty 6, 84d supply, fill #0

## 2023-05-04 MED ORDER — DAPAGLIFLOZIN PROPANEDIOL 5 MG PO TABS
5.0000 mg | ORAL_TABLET | Freq: Every day | ORAL | 1 refills | Status: DC
  Filled 2023-05-04: qty 90, 90d supply, fill #0

## 2023-05-04 MED ORDER — DAPAGLIFLOZIN PROPANEDIOL 5 MG PO TABS: 5.0000 mg | ORAL_TABLET | Freq: Every day | ORAL | 1 refills | Status: DC

## 2023-05-04 MED ORDER — METFORMIN HCL 500 MG PO TABS: 500.0000 mg | ORAL_TABLET | Freq: Two times a day (BID) | ORAL | 1 refills | Status: DC

## 2023-05-04 MED ORDER — TIRZEPATIDE 10 MG/0.5ML ~~LOC~~ SOAJ: 10.0000 mg | SUBCUTANEOUS | 1 refills | Status: DC

## 2023-05-04 MED ORDER — METFORMIN HCL 500 MG PO TABS
500.0000 mg | ORAL_TABLET | Freq: Two times a day (BID) | ORAL | 1 refills | Status: DC
  Filled 2023-05-04: qty 180, 90d supply, fill #0

## 2023-05-04 NOTE — Telephone Encounter (Signed)
sent 

## 2023-05-04 NOTE — Addendum Note (Signed)
Addended byAlfredia Ferguson on: 05/04/2023 11:39 AM   Modules accepted: Orders

## 2023-05-04 NOTE — Telephone Encounter (Signed)
Patient returned post procedure phone call.  States she had a good night, but if she had this again she would like to be put to sleep.  I informed her that we do not put people to sleep for these procedures.

## 2023-05-04 NOTE — Telephone Encounter (Signed)
PT stated that she was return a call from a nurse, patient had a proc done on yesterday. Please give patient a call back. TY

## 2023-05-04 NOTE — Telephone Encounter (Signed)
Post procedure follow up.  LM 

## 2023-05-07 ENCOUNTER — Other Ambulatory Visit: Payer: Self-pay | Admitting: Physician Assistant

## 2023-05-07 ENCOUNTER — Other Ambulatory Visit: Payer: Self-pay

## 2023-05-07 DIAGNOSIS — E119 Type 2 diabetes mellitus without complications: Secondary | ICD-10-CM

## 2023-05-07 MED ORDER — TIRZEPATIDE 10 MG/0.5ML ~~LOC~~ SOAJ
10.0000 mg | SUBCUTANEOUS | 1 refills | Status: DC
  Filled 2023-05-07: qty 6, 84d supply, fill #0
  Filled 2023-05-26: qty 2, 28d supply, fill #0
  Filled 2023-06-23: qty 2, 28d supply, fill #1

## 2023-05-08 ENCOUNTER — Other Ambulatory Visit: Payer: Self-pay | Admitting: Physician Assistant

## 2023-05-08 DIAGNOSIS — E1169 Type 2 diabetes mellitus with other specified complication: Secondary | ICD-10-CM

## 2023-05-08 DIAGNOSIS — I10 Essential (primary) hypertension: Secondary | ICD-10-CM

## 2023-05-10 ENCOUNTER — Other Ambulatory Visit: Payer: Self-pay | Admitting: Physician Assistant

## 2023-05-10 ENCOUNTER — Other Ambulatory Visit (HOSPITAL_BASED_OUTPATIENT_CLINIC_OR_DEPARTMENT_OTHER): Payer: Self-pay

## 2023-05-10 ENCOUNTER — Other Ambulatory Visit: Payer: Self-pay

## 2023-05-10 DIAGNOSIS — E1169 Type 2 diabetes mellitus with other specified complication: Secondary | ICD-10-CM

## 2023-05-10 DIAGNOSIS — I10 Essential (primary) hypertension: Secondary | ICD-10-CM

## 2023-05-10 MED FILL — Rosuvastatin Calcium Tab 10 MG: ORAL | 90 days supply | Qty: 90 | Fill #0 | Status: AC

## 2023-05-10 MED FILL — Losartan Potassium Tab 100 MG: ORAL | 90 days supply | Qty: 90 | Fill #0 | Status: AC

## 2023-05-11 ENCOUNTER — Other Ambulatory Visit: Payer: Self-pay

## 2023-05-26 ENCOUNTER — Other Ambulatory Visit: Payer: Self-pay

## 2023-06-02 ENCOUNTER — Other Ambulatory Visit: Payer: Self-pay

## 2023-06-02 ENCOUNTER — Encounter: Payer: Self-pay | Admitting: Student in an Organized Health Care Education/Training Program

## 2023-06-02 ENCOUNTER — Ambulatory Visit
Payer: 59 | Attending: Student in an Organized Health Care Education/Training Program | Admitting: Student in an Organized Health Care Education/Training Program

## 2023-06-02 VITALS — BP 128/75 | HR 89 | Temp 97.2°F | Resp 17 | Ht 68.0 in | Wt 263.0 lb

## 2023-06-02 DIAGNOSIS — M5416 Radiculopathy, lumbar region: Secondary | ICD-10-CM | POA: Diagnosis not present

## 2023-06-02 DIAGNOSIS — Z9889 Other specified postprocedural states: Secondary | ICD-10-CM | POA: Insufficient documentation

## 2023-06-02 DIAGNOSIS — G8929 Other chronic pain: Secondary | ICD-10-CM | POA: Diagnosis present

## 2023-06-02 DIAGNOSIS — M1711 Unilateral primary osteoarthritis, right knee: Secondary | ICD-10-CM | POA: Diagnosis present

## 2023-06-02 DIAGNOSIS — G894 Chronic pain syndrome: Secondary | ICD-10-CM | POA: Diagnosis not present

## 2023-06-02 MED ORDER — TIZANIDINE HCL 2 MG PO TABS
2.0000 mg | ORAL_TABLET | Freq: Two times a day (BID) | ORAL | 2 refills | Status: DC | PRN
Start: 2023-06-02 — End: 2024-07-05
  Filled 2023-06-02: qty 60, 15d supply, fill #0
  Filled 2023-09-06: qty 60, 15d supply, fill #1
  Filled 2023-10-03: qty 60, 15d supply, fill #2

## 2023-06-02 MED ORDER — GABAPENTIN 300 MG PO CAPS
300.0000 mg | ORAL_CAPSULE | Freq: Two times a day (BID) | ORAL | 2 refills | Status: DC
Start: 2023-06-02 — End: 2024-03-15
  Filled 2023-06-02: qty 60, 30d supply, fill #0
  Filled 2023-06-29: qty 60, 30d supply, fill #1
  Filled 2023-08-05: qty 60, 30d supply, fill #2

## 2023-06-02 NOTE — Progress Notes (Signed)
Safety precautions to be maintained throughout the outpatient stay will include: orient to surroundings, keep bed in low position, maintain call bell within reach at all times, provide assistance with transfer out of bed and ambulation.  

## 2023-06-02 NOTE — Patient Instructions (Addendum)
You have been given Rx and letter of medical necessity for Zynex knee brace.  GENERAL RISKS AND COMPLICATIONS  What are the risk, side effects and possible complications? Generally speaking, most procedures are safe.  However, with any procedure there are risks, side effects, and the possibility of complications.  The risks and complications are dependent upon the sites that are lesioned, or the type of nerve block to be performed.  The closer the procedure is to the spine, the more serious the risks are.  Great care is taken when placing the radio frequency needles, block needles or lesioning probes, but sometimes complications can occur. Infection: Any time there is an injection through the skin, there is a risk of infection.  This is why sterile conditions are used for these blocks.  There are four possible types of infection. Localized skin infection. Central Nervous System Infection-This can be in the form of Meningitis, which can be deadly. Epidural Infections-This can be in the form of an epidural abscess, which can cause pressure inside of the spine, causing compression of the spinal cord with subsequent paralysis. This would require an emergency surgery to decompress, and there are no guarantees that the patient would recover from the paralysis. Discitis-This is an infection of the intervertebral discs.  It occurs in about 1% of discography procedures.  It is difficult to treat and it may lead to surgery.        2. Pain: the needles have to go through skin and soft tissues, will cause soreness.       3. Damage to internal structures:  The nerves to be lesioned may be near blood vessels or    other nerves which can be potentially damaged.       4. Bleeding: Bleeding is more common if the patient is taking blood thinners such as  aspirin, Coumadin, Ticiid, Plavix, etc., or if he/she have some genetic predisposition  such as hemophilia. Bleeding into the spinal canal can cause compression of the  spinal  cord with subsequent paralysis.  This would require an emergency surgery to  decompress and there are no guarantees that the patient would recover from the  paralysis.       5. Pneumothorax:  Puncturing of a lung is a possibility, every time a needle is introduced in  the area of the chest or upper back.  Pneumothorax refers to free air around the  collapsed lung(s), inside of the thoracic cavity (chest cavity).  Another two possible  complications related to a similar event would include: Hemothorax and Chylothorax.   These are variations of the Pneumothorax, where instead of air around the collapsed  lung(s), you may have blood or chyle, respectively.       6. Spinal headaches: They may occur with any procedures in the area of the spine.       7. Persistent CSF (Cerebro-Spinal Fluid) leakage: This is a rare problem, but may occur  with prolonged intrathecal or epidural catheters either due to the formation of a fistulous  track or a dural tear.       8. Nerve damage: By working so close to the spinal cord, there is always a possibility of  nerve damage, which could be as serious as a permanent spinal cord injury with  paralysis.       9. Death:  Although rare, severe deadly allergic reactions known as "Anaphylactic  reaction" can occur to any of the medications used.      10. Worsening  of the symptoms:  We can always make thing worse.  What are the chances of something like this happening? Chances of any of this occuring are extremely low.  By statistics, you have more of a chance of getting killed in a motor vehicle accident: while driving to the hospital than any of the above occurring .  Nevertheless, you should be aware that they are possibilities.  In general, it is similar to taking a shower.  Everybody knows that you can slip, hit your head and get killed.  Does that mean that you should not shower again?  Nevertheless always keep in mind that statistics do not mean anything if you happen  to be on the wrong side of them.  Even if a procedure has a 1 (one) in a 1,000,000 (million) chance of going wrong, it you happen to be that one..Also, keep in mind that by statistics, you have more of a chance of having something go wrong when taking medications.  Who should not have this procedure? If you are on a blood thinning medication (e.g. Coumadin, Plavix, see list of "Blood Thinners"), or if you have an active infection going on, you should not have the procedure.  If you are taking any blood thinners, please inform your physician.  How should I prepare for this procedure? Do not eat or drink anything at least six hours prior to the procedure. Bring a driver with you .  It cannot be a taxi. Come accompanied by an adult that can drive you back, and that is strong enough to help you if your legs get weak or numb from the local anesthetic. Take all of your medicines the morning of the procedure with just enough water to swallow them. If you have diabetes, make sure that you are scheduled to have your procedure done first thing in the morning, whenever possible. If you have diabetes, take only half of your insulin dose and notify our nurse that you have done so as soon as you arrive at the clinic. If you are diabetic, but only take blood sugar pills (oral hypoglycemic), then do not take them on the morning of your procedure.  You may take them after you have had the procedure. Do not take aspirin or any aspirin-containing medications, at least eleven (11) days prior to the procedure.  They may prolong bleeding. Wear loose fitting clothing that may be easy to take off and that you would not mind if it got stained with Betadine or blood. Do not wear any jewelry or perfume Remove any nail coloring.  It will interfere with some of our monitoring equipment.  NOTE: Remember that this is not meant to be interpreted as a complete list of all possible complications.  Unforeseen problems may  occur.  BLOOD THINNERS The following drugs contain aspirin or other products, which can cause increased bleeding during surgery and should not be taken for 2 weeks prior to and 1 week after surgery.  If you should need take something for relief of minor pain, you may take acetaminophen which is found in Tylenol,m Datril, Anacin-3 and Panadol. It is not blood thinner. The products listed below are.  Do not take any of the products listed below in addition to any listed on your instruction sheet.  A.P.C or A.P.C with Codeine Codeine Phosphate Capsules #3 Ibuprofen Ridaura  ABC compound Congesprin Imuran rimadil  Advil Cope Indocin Robaxisal  Alka-Seltzer Effervescent Pain Reliever and Antacid Coricidin or Coricidin-D  Indomethacin Rufen  Alka-Seltzer plus Cold Medicine Cosprin Ketoprofen S-A-C Tablets  Anacin Analgesic Tablets or Capsules Coumadin Korlgesic Salflex  Anacin Extra Strength Analgesic tablets or capsules CP-2 Tablets Lanoril Salicylate  Anaprox Cuprimine Capsules Levenox Salocol  Anexsia-D Dalteparin Magan Salsalate  Anodynos Darvon compound Magnesium Salicylate Sine-off  Ansaid Dasin Capsules Magsal Sodium Salicylate  Anturane Depen Capsules Marnal Soma  APF Arthritis pain formula Dewitt's Pills Measurin Stanback  Argesic Dia-Gesic Meclofenamic Sulfinpyrazone  Arthritis Bayer Timed Release Aspirin Diclofenac Meclomen Sulindac  Arthritis pain formula Anacin Dicumarol Medipren Supac  Analgesic (Safety coated) Arthralgen Diffunasal Mefanamic Suprofen  Arthritis Strength Bufferin Dihydrocodeine Mepro Compound Suprol  Arthropan liquid Dopirydamole Methcarbomol with Aspirin Synalgos  ASA tablets/Enseals Disalcid Micrainin Tagament  Ascriptin Doan's Midol Talwin  Ascriptin A/D Dolene Mobidin Tanderil  Ascriptin Extra Strength Dolobid Moblgesic Ticlid  Ascriptin with Codeine Doloprin or Doloprin with Codeine Momentum Tolectin  Asperbuf Duoprin Mono-gesic Trendar  Aspergum Duradyne  Motrin or Motrin IB Triminicin  Aspirin plain, buffered or enteric coated Durasal Myochrisine Trigesic  Aspirin Suppositories Easprin Nalfon Trillsate  Aspirin with Codeine Ecotrin Regular or Extra Strength Naprosyn Uracel  Atromid-S Efficin Naproxen Ursinus  Auranofin Capsules Elmiron Neocylate Vanquish  Axotal Emagrin Norgesic Verin  Azathioprine Empirin or Empirin with Codeine Normiflo Vitamin E  Azolid Emprazil Nuprin Voltaren  Bayer Aspirin plain, buffered or children's or timed BC Tablets or powders Encaprin Orgaran Warfarin Sodium  Buff-a-Comp Enoxaparin Orudis Zorpin  Buff-a-Comp with Codeine Equegesic Os-Cal-Gesic   Buffaprin Excedrin plain, buffered or Extra Strength Oxalid   Bufferin Arthritis Strength Feldene Oxphenbutazone   Bufferin plain or Extra Strength Feldene Capsules Oxycodone with Aspirin   Bufferin with Codeine Fenoprofen Fenoprofen Pabalate or Pabalate-SF   Buffets II Flogesic Panagesic   Buffinol plain or Extra Strength Florinal or Florinal with Codeine Panwarfarin   Buf-Tabs Flurbiprofen Penicillamine   Butalbital Compound Four-way cold tablets Penicillin   Butazolidin Fragmin Pepto-Bismol   Carbenicillin Geminisyn Percodan   Carna Arthritis Reliever Geopen Persantine   Carprofen Gold's salt Persistin   Chloramphenicol Goody's Phenylbutazone   Chloromycetin Haltrain Piroxlcam   Clmetidine heparin Plaquenil   Cllnoril Hyco-pap Ponstel   Clofibrate Hydroxy chloroquine Propoxyphen         Before stopping any of these medications, be sure to consult the physician who ordered them.  Some, such as Coumadin (Warfarin) are ordered to prevent or treat serious conditions such as "deep thrombosis", "pumonary embolisms", and other heart problems.  The amount of time that you may need off of the medication may also vary with the medication and the reason for which you were taking it.  If you are taking any of these medications, please make sure you notify your pain  physician before you undergo any procedures.         Moderate Conscious Sedation, Adult Sedation is the use of medicines to help you relax and not feel pain. Moderate conscious sedation is a type of sedation that makes you less alert than normal. You are still able to respond to instructions, touch, or both. This type of sedation is used during short medical and dental procedures. It is milder than deep sedation, which is a type of sedation you cannot be easily woken up from. It is also milder than general anesthesia, which is the use of medicines to make you fall asleep. Moderate conscious sedation lets you return to your normal activities sooner. Tell a health care provider about: Any allergies you have. All medicines you are taking, including vitamins, herbs, steroids, eye  drops, creams, and over-the-counter medicines. Any problems you or family members have had with anesthesia. Any bleeding problems you have. Any surgeries you have had. Any medical conditions you have. Whether you are pregnant or may be pregnant. Any recent alcohol, tobacco, or drug use. What are the risks? Your health care provider will talk with you about risks. These may include: Oversedation. This is when you get too much medicine. Nausea or vomiting. Allergic reaction to medicines. Trouble breathing. If this happens, a breathing tube may be used. It will be removed when you can breathe better on your own. Heart trouble. Lung trouble. Emergence delirium. This is when you feel confused while the sedation wears off. This gets better with time. What happens before the procedure? When to stop eating and drinking Follow instructions from your health care provider about what you may eat and drink. These may include: 8 hours before your procedure Stop eating most foods. Do not eat meat, fried foods, or fatty foods. Eat only light foods, such as toast or crackers. All liquids are okay except energy drinks and  alcohol. 6 hours before your procedure Stop eating. Drink only clear liquids, such as water, clear fruit juice, black coffee, plain tea, and sports drinks. Do not drink energy drinks or alcohol. 2 hours before your procedure Stop drinking all liquids. You may be allowed to take medicines with small sips of water. If you do not follow your health care provider's instructions, your procedure may be delayed or canceled. Medicines Ask your health care provider about: Changing or stopping your regular medicines. These include any diabetes medicines or blood thinners you take. Taking medicines such as aspirin and ibuprofen. These medicines can thin your blood. Do not take them unless your health care provider tells you to. Taking over-the-counter medicines, vitamins, herbs, and supplements. Tests and exams You may have an exam or testing. You may have a blood or urine sample taken. General instructions Do not use any products that contain nicotine or tobacco for at least 4 weeks before the procedure. These products include cigarettes, chewing tobacco, and vaping devices, such as e-cigarettes. If you need help quitting, ask your health care provider. If you will be going home right after the procedure, plan to have a responsible adult: Take you home from the hospital or clinic. You will not be allowed to drive. Care for you for the time you are told. What happens during the procedure?  You will be given the sedative. It may be given: As a pill you can take by mouth. It can also be put into the rectum. As a spray through the nose. As an injection into muscle. As an injection into a vein through an IV. You may be given oxygen as needed. Your blood pressure, heart rate, breathing rate, and blood oxygen level will be monitored during the procedure. The medical or dental procedure will be done. The procedure may vary among health care providers and hospitals. What happens after the  procedure? Your blood pressure, heart rate, breathing rate, and blood oxygen level will be monitored until you leave the hospital or clinic. You will get fluids through an IV as needed. Do not drive or operate machinery until your health care provider says that it is safe. This information is not intended to replace advice given to you by your health care provider. Make sure you discuss any questions you have with your health care provider. Document Revised: 05/18/2022 Document Reviewed: 05/18/2022 [employer] Patient Education  2024 [employer] Epidural Steroid Injection Patient Information  Description: The epidural space surrounds the nerves as they exit the spinal cord.  In some patients, the nerves can be compressed and inflamed by a bulging disc or a tight spinal canal (spinal stenosis).  By injecting steroids into the epidural space, we can bring irritated nerves into direct contact with a potentially helpful medication.  These steroids act directly on the irritated nerves and can reduce swelling and inflammation which often leads to decreased pain.  Epidural steroids may be injected anywhere along the spine and from the neck to the low back depending upon the location of your pain.   After numbing the skin with local anesthetic (like Novocaine), a small needle is passed into the epidural space slowly.  You may experience a sensation of pressure while this is being done.  The entire block usually last less than 10 minutes.  Conditions which may be treated by epidural steroids:  Low back and leg pain Neck and arm pain Spinal stenosis Post-laminectomy syndrome Herpes zoster (shingles) pain Pain from compression fractures  Preparation for the injection:  Do not eat any solid food or dairy products within 8 hours of your appointment.  You may drink clear liquids up to 3 hours before appointment.  Clear liquids include water, black coffee, juice or soda.  No milk or cream please. You may  take your regular medication, including pain medications, with a sip of water before your appointment  Diabetics should hold regular insulin (if taken separately) and take 1/2 normal NPH dos the morning of the procedure.  Carry some sugar containing items with you to your appointment. A driver must accompany you and be prepared to drive you home after your procedure.  Bring all your current medications with your. An IV may be inserted and sedation may be given at the discretion of the physician.   A blood pressure cuff, EKG and other monitors will often be applied during the procedure.  Some patients may need to have extra oxygen administered for a short period. You will be asked to provide medical information, including your allergies, prior to the procedure.  We must know immediately if you are taking blood thinners (like Coumadin/Warfarin)  Or if you are allergic to IV iodine contrast (dye). We must know if you could possible be pregnant.  Possible side-effects: Bleeding from needle site Infection (rare, may require surgery) Nerve injury (rare) Numbness & tingling (temporary) Difficulty urinating (rare, temporary) Spinal headache ( a headache worse with upright posture) Light -headedness (temporary) Pain at injection site (several days) Decreased blood pressure (temporary) Weakness in arm/leg (temporary) Pressure sensation in back/neck (temporary)  Call if you experience: Fever/chills associated with headache or increased back/neck pain. Headache worsened by an upright position. New onset weakness or numbness of an extremity below the injection site Hives or difficulty breathing (go to the emergency room) Inflammation or drainage at the infection site Severe back/neck pain Any new symptoms which are concerning to you  Please note:  Although the local anesthetic injected can often make your back or neck feel good for several hours after the injection, the pain will likely return.  It  takes 3-7 days for steroids to work in the epidural space.  You may not notice any pain relief for at least that one week.  If effective, we will often do a series of three injections spaced 3-6 weeks apart to maximally decrease your pain.  After the initial series, we generally will wait several  months before considering a repeat injection of the same type.  If you have any questions, please call [phone] [facility]

## 2023-06-02 NOTE — Progress Notes (Signed)
PROVIDER NOTE: Information contained herein reflects review and annotations entered in association with encounter. Interpretation of such information and data should be left to medically-trained personnel. Information provided to patient can be located elsewhere in the medical record under "Patient Instructions". Document created using STT-dictation technology, any transcriptional errors that may result from process are unintentional.    Patient: Denise Velez  Service Category: E/M  Provider: Edward Jolly, MD  DOB: 1966/06/19  DOS: 06/02/2023  Referring Provider: Burnett Corrente  MRN: [id number]  Specialty: Interventional Pain Management  PCP: Alfredia Ferguson, PA-C  Type: Established Patient  Setting: Ambulatory outpatient    Location: Office  Delivery: Face-to-face     HPI  [name], a 57 y.o. year old female, is here today because of her Chronic radicular lumbar pain [M54.16, G89.29]. Denise Velez primary complain today is Back Pain  Pain Assessment: Severity of Chronic pain is reported as a 5 /10. Location: Back Lower/through hips and down back of legs bilat to calves. Onset: 1 to 4 weeks ago. Quality: Tingling. Timing: Constant. Modifying factor(s): tylenol, procedure. Vitals:  height is 5\' 8"  (1.727 m) and weight is 263 lb (119.3 kg). Her temporal temperature is 97.2 F (36.2 C) (abnormal). Her blood pressure is 128/75 and her pulse is 89. Her respiration is 17 and oxygen saturation is 100%.  BMI: Estimated body mass index is 39.99 kg/m as calculated from the following:   Height as of this encounter: 5\' 8"  (1.727 m).   Weight as of this encounter: 263 lb (119.3 kg). Last encounter: 04/13/2023. Last procedure: 05/03/2023.  Reason for encounter: post-procedure evaluation and assessment.    Post-procedure evaluation   Procedure: Lumbar epidural steroid injection (LESI) (interlaminar) #1    Laterality: Midline   Level:  L4-5 Level.  Imaging: Fluoroscopic guidance          Anesthesia: Local anesthesia (1-2% Lidocaine) Anxiolysis: IV Versed 2 mg DOS: 05/03/2023  Performed by: Edward Jolly, MD  Purpose: Diagnostic/Therapeutic Indications: Lumbar radicular pain of intraspinal etiology of more than 4 weeks that has failed to respond to conservative therapy and is severe enough to impact quality of life or function. 1. Chronic radicular lumbar pain   2. Lumbar radiculopathy    NAS-11 Pain score:   Pre-procedure: 7 /10   Post-procedure: 0-No pain/10      Effectiveness:  Initial hour after procedure: 100 %  Subsequent 4-6 hours post-procedure: 100 %  Analgesia past initial 6 hours: 60% for the first week (was able to ambulate w/o cane) then reduced to 50% for the second week, now 40 % (walking distance is still difficult; ability to perform ADL's has improved)  Ongoing improvement:  Analgesic:  <25% Function: Somewhat improved ROM: Somewhat improved   ROS  Constitutional: Denies any fever or chills Gastrointestinal: No reported hemesis, hematochezia, vomiting, or acute GI distress Musculoskeletal:  low back and right leg pain Neurological: No reported episodes of acute onset apraxia, aphasia, dysarthria, agnosia, amnesia, paralysis, loss of coordination, or loss of consciousness  Medication Review  Accu-Chek Guide, Accu-Chek Softclix Lancets, Blood Pressure Cuff, amLODipine, blood glucose meter kit and supplies, conjugated estrogens, dapagliflozin propanediol, estradiol, gabapentin, glucose blood, hydrochlorothiazide, losartan, metFORMIN, rosuvastatin, tiZANidine, and tirzepatide  History Review  Allergy: Denise Velez is allergic to sulfa antibiotics. Drug: Denise Velez  reports that she does not currently use drugs after having used the following drugs: Marijuana. Alcohol:  reports that she does not currently use alcohol. Tobacco:  reports that she quit smoking  about 16 months ago. Her smoking use included cigarettes. She started smoking about 6 years ago.  She has a 2.5 pack-year smoking history. She has never used smokeless tobacco. Social: Ms. Velez  reports that she quit smoking about 16 months ago. Her smoking use included cigarettes. She started smoking about 6 years ago. She has a 2.5 pack-year smoking history. She has never used smokeless tobacco. She reports that she does not currently use alcohol. She reports that she does not currently use drugs after having used the following drugs: Marijuana. Medical:  has a past medical history of Anxiety (03/2022), Arthritis, Depression (03/2022), Diabetes mellitus without complication (HCC), Hypertension, and Osteoporosis. Surgical: Denise Velez  has a past surgical history that includes Cesarean section; Knee arthroscopy with medial menisectomy (Right, 05/08/2021); Tubal ligation (04/1993); Breast biopsy (Left, 01/20/2023); Breast biopsy (Left, 01/20/2023); Breast biopsy (Left, 01/20/2023); and Breast biopsy (Left, 01/20/2023). Family: family history includes Arthritis in her mother; Diabetes in her brother, daughter, and mother; Hypertension in her brother, daughter, and mother; Kidney disease in her mother; Obesity in her daughter; Stroke in her mother.  Laboratory Chemistry Profile   Renal Lab Results  Component Value Date   BUN 12 09/25/2022   CREATININE 0.75 09/25/2022   BCR 16 09/25/2022   GFRAA >60 05/13/2019   GFRNONAA >60 05/06/2021    Hepatic Lab Results  Component Value Date   AST 17 09/25/2022   ALT 18 09/25/2022   ALBUMIN 4.4 09/25/2022   ALKPHOS 123 (H) 09/25/2022    Electrolytes Lab Results  Component Value Date   NA 141 09/25/2022   K 4.4 09/25/2022   CL 100 09/25/2022   CALCIUM 9.9 09/25/2022    Bone No results found for: "VD25OH", "VD125OH2TOT", "OZ3086VH8", "IO9629BM8", "25OHVITD1", "25OHVITD2", "25OHVITD3", "TESTOFREE", "TESTOSTERONE"  Inflammation (CRP: Acute Phase) (ESR: Chronic Phase) No results found for: "CRP", "ESRSEDRATE", "LATICACIDVEN"       Note: Above Lab  results reviewed.   Physical Exam  General appearance: Well nourished, well developed, and well hydrated. In no apparent acute distress Mental status: Alert, oriented x 3 (person, place, & time)       Respiratory: No evidence of acute respiratory distress Eyes: PERLA Vitals: BP 128/75   Pulse 89   Temp (!) 97.2 F (36.2 C) (Temporal)   Resp 17   Ht 5\' 8"  (1.727 m)   Wt 263 lb (119.3 kg)   LMP  (LMP Unknown)   SpO2 100%   BMI 39.99 kg/m  BMI: Estimated body mass index is 39.99 kg/m as calculated from the following:   Height as of this encounter: 5\' 8"  (1.727 m).   Weight as of this encounter: 263 lb (119.3 kg). Ideal: Ideal body weight: 63.9 kg (140 lb 14 oz) Adjusted ideal body weight: 86.1 kg (189 lb 11.6 oz)  Lumbar Spine Area Exam  Skin & Axial Inspection: No masses, redness, or swelling Alignment: Symmetrical Functional ROM: Pain restricted ROM affecting both sides Stability: No instability detected Muscle Tone/Strength: Functionally intact. No obvious neuro-muscular anomalies detected. Sensory (Neurological): Dermatomal pain pattern Palpation: No palpable anomalies       Provocative Tests: Hyperextension/rotation test: (+) due to pain. Lumbar quadrant test (Kemp's test): (+) bilateral for foraminal stenosis Lateral bending test: (+) ipsilateral radicular pain, bilaterally. Positive for bilateral foraminal stenosis.   Gait & Posture Assessment  Ambulation: Unassisted Gait: Relatively normal for age and body habitus Posture: WNL  Lower Extremity Exam      Side: Right lower extremity   Side:  Left lower extremity  Stability: No instability observed           Stability: No instability observed          Skin & Extremity Inspection: Skin color, temperature, and hair growth are WNL. No peripheral edema or cyanosis. No masses, redness, swelling, asymmetry, or associated skin lesions. No contractures.   Skin & Extremity Inspection: Skin color, temperature, and hair growth are  WNL. No peripheral edema or cyanosis. No masses, redness, swelling, asymmetry, or associated skin lesions. No contractures.  Functional ROM: Unrestricted ROM                   Functional ROM: Unrestricted ROM                  Muscle Tone/Strength: Functionally intact. No obvious neuro-muscular anomalies detected.   Muscle Tone/Strength: Functionally intact. No obvious neuro-muscular anomalies detected.  Sensory (Neurological): Unimpaired         Sensory (Neurological): Unimpaired        DTR: Patellar: deferred today Achilles: deferred today Plantar: deferred today   DTR: Patellar: deferred today Achilles: deferred today Plantar: deferred today  Palpation: No palpable anomalies   Palpation: No palpable anomalies     Assessment   Diagnosis Status  1. Chronic radicular lumbar pain   2. Lumbar radiculopathy   3. Chronic pain syndrome    Responding Responding Controlled   Updated Problems: Problem  Chronic Pain Syndrome  Chronic Radicular Lumbar Pain    Plan of Care  Problem-specific:   Positive response to first lumbar epidural steroid injection.  Patient notes pain reduction as detailed above and ability to perform ADLs more comfortably and in less pain.  She was also able to ambulate without her cane for the first week after her last epidural and was more functional.  She states that her pain is returning and she would like to repeat lumbar epidural steroid injection.   1. Chronic radicular lumbar pain - Lumbar Epidural Injection; Future - gabapentin (NEURONTIN) 300 MG capsule; Take 1 capsule (300 mg total) by mouth 2 (two) times daily.  Dispense: 60 capsule; Refill: 2 - tiZANidine (ZANAFLEX) 2 MG tablet; Take 1-2 tablets (2-4 mg total) by mouth every 12 (twelve) hours as needed for muscle spasms.  Dispense: 60 tablet; Refill: 2  2. Lumbar radiculopathy - Lumbar Epidural Injection; Future - gabapentin (NEURONTIN) 300 MG capsule; Take 1 capsule (300 mg total) by mouth 2  (two) times daily.  Dispense: 60 capsule; Refill: 2 - tiZANidine (ZANAFLEX) 2 MG tablet; Take 1-2 tablets (2-4 mg total) by mouth every 12 (twelve) hours as needed for muscle spasms.  Dispense: 60 tablet; Refill: 2  3. Chronic pain syndrome - Lumbar Epidural Injection; Future - gabapentin (NEURONTIN) 300 MG capsule; Take 1 capsule (300 mg total) by mouth 2 (two) times daily.  Dispense: 60 capsule; Refill: 2 - tiZANidine (ZANAFLEX) 2 MG tablet; Take 1-2 tablets (2-4 mg total) by mouth every 12 (twelve) hours as needed for muscle spasms.  Dispense: 60 tablet; Refill: 2  -Also ordered Zynex knee brace for right knee support    [name] has a current medication list which includes the following long-term medication(s): amlodipine, gabapentin, hydrochlorothiazide, losartan, metformin, rosuvastatin, and tizanidine.  Pharmacotherapy (Medications Ordered): Meds ordered this encounter  Medications   gabapentin (NEURONTIN) 300 MG capsule    Sig: Take 1 capsule (300 mg total) by mouth 2 (two) times daily.    Dispense:  60 capsule  Refill:  2    Fill one day early if pharmacy is closed on scheduled refill date. May substitute for generic if available.   tiZANidine (ZANAFLEX) 2 MG tablet    Sig: Take 1-2 tablets (2-4 mg total) by mouth every 12 (twelve) hours as needed for muscle spasms.    Dispense:  60 tablet    Refill:  2    Do not place this medication, or any other prescription from our practice, on "Automatic Refill". Patient may have prescription filled one day early if pharmacy is closed on scheduled refill date.   Orders:  Orders Placed This Encounter  Procedures   Lumbar Epidural Injection    Standing Status:   Future    Standing Expiration Date:   09/02/2023    Scheduling Instructions:     Procedure: Interlaminar Lumbar Epidural Steroid injection (LESI)            Laterality: RIGHT     Sedation: IV Versed     Timeframe: ASAA    Order Specific Question:   Where will  this procedure be performed?    Answer:   [facility]   Follow-up plan:   Return in about 2 weeks (around 06/16/2023) for L4/5 ESI , in clinic IV Versed.     Recent Visits Date Type Provider Dept  05/03/23 Procedure visit Edward Jolly, MD [facility]  04/13/23 Office Visit Edward Jolly, MD [facility]  Showing recent visits within past 90 days and meeting all other requirements Today's Visits Date Type Provider Dept  06/02/23 Office Visit Edward Jolly, MD [facility]  Showing today's visits and meeting all other requirements Future Appointments No visits were found meeting these conditions. Showing future appointments within next 90 days and meeting all other requirements  I discussed the assessment and treatment plan with the patient. The patient was provided an opportunity to ask questions and all were answered. The patient agreed with the plan and demonstrated an understanding of the instructions.  Patient advised to call back or seek an in-person evaluation if the symptoms or condition worsens.  Duration of encounter: .  Total time on encounter, as per AMA guidelines included both the face-to-face and non-face-to-face time personally spent by the physician and/or other qualified health care professional(s) on the day of the encounter (includes time in activities that require the physician or other qualified health care professional and does not include time in activities normally performed by clinical staff). Physician's time may include the following activities when performed: Preparing to see the patient (e.g., pre-charting review of records, searching for previously ordered imaging, lab work, and nerve conduction tests) Review of prior analgesic pharmacotherapies. Reviewing PMP Interpreting ordered tests (e.g., lab work, imaging, nerve conduction tests) Performing post-procedure evaluations, including interpretation of diagnostic  procedures Obtaining and/or reviewing separately obtained history Performing a medically appropriate examination and/or evaluation Counseling and educating the patient/family/caregiver Ordering medications, tests, or procedures Referring and communicating with other health care professionals (when not separately reported) Documenting clinical information in the electronic or other health record Independently interpreting results (not separately reported) and communicating results to the patient/ family/caregiver Care coordination (not separately reported)  Note by: Edward Jolly, MD Date: 06/02/2023; Time: 2:25 PM

## 2023-06-03 ENCOUNTER — Other Ambulatory Visit: Payer: Self-pay

## 2023-06-16 ENCOUNTER — Ambulatory Visit: Payer: 59 | Admitting: Student in an Organized Health Care Education/Training Program

## 2023-06-23 ENCOUNTER — Other Ambulatory Visit: Payer: Self-pay

## 2023-07-05 ENCOUNTER — Ambulatory Visit: Payer: 59 | Admitting: Student in an Organized Health Care Education/Training Program

## 2023-07-15 ENCOUNTER — Other Ambulatory Visit: Payer: Self-pay

## 2023-07-15 ENCOUNTER — Encounter: Payer: Self-pay | Admitting: Physician Assistant

## 2023-07-15 ENCOUNTER — Ambulatory Visit (INDEPENDENT_AMBULATORY_CARE_PROVIDER_SITE_OTHER): Payer: 59 | Admitting: Physician Assistant

## 2023-07-15 VITALS — BP 120/81 | HR 87 | Wt 266.6 lb

## 2023-07-15 DIAGNOSIS — E1159 Type 2 diabetes mellitus with other circulatory complications: Secondary | ICD-10-CM | POA: Diagnosis not present

## 2023-07-15 DIAGNOSIS — I152 Hypertension secondary to endocrine disorders: Secondary | ICD-10-CM

## 2023-07-15 DIAGNOSIS — E1169 Type 2 diabetes mellitus with other specified complication: Secondary | ICD-10-CM | POA: Diagnosis not present

## 2023-07-15 DIAGNOSIS — Z6841 Body Mass Index (BMI) 40.0 and over, adult: Secondary | ICD-10-CM

## 2023-07-15 DIAGNOSIS — Z23 Encounter for immunization: Secondary | ICD-10-CM

## 2023-07-15 DIAGNOSIS — E119 Type 2 diabetes mellitus without complications: Secondary | ICD-10-CM

## 2023-07-15 DIAGNOSIS — E785 Hyperlipidemia, unspecified: Secondary | ICD-10-CM

## 2023-07-15 DIAGNOSIS — Z7985 Long-term (current) use of injectable non-insulin antidiabetic drugs: Secondary | ICD-10-CM

## 2023-07-15 LAB — POCT GLYCOSYLATED HEMOGLOBIN (HGB A1C): Hemoglobin A1C: 5.9 % — AB (ref 4.0–5.6)

## 2023-07-15 MED ORDER — TIRZEPATIDE 12.5 MG/0.5ML ~~LOC~~ SOAJ
12.5000 mg | SUBCUTANEOUS | 1 refills | Status: DC
Start: 2023-07-15 — End: 2023-12-23
  Filled 2023-07-15: qty 6, 84d supply, fill #0
  Filled 2023-07-18: qty 2, 28d supply, fill #0
  Filled 2023-08-05 – 2023-08-09 (×2): qty 2, 28d supply, fill #1
  Filled 2023-09-06: qty 2, 28d supply, fill #2
  Filled 2023-09-27: qty 2, 28d supply, fill #3
  Filled 2023-10-27: qty 2, 28d supply, fill #4
  Filled 2023-11-25: qty 2, 28d supply, fill #5

## 2023-07-15 NOTE — Progress Notes (Signed)
Established patient visit  Patient: Denise Velez   DOB: 12/02/1965   57 y.o. Female  MRN: [id number] Visit Date: 07/15/2023  Today's healthcare provider: Debera Lat, PA-C   Chief Complaint  Patient presents with   Medication Refill   Medical Management of Chronic Issues    Patient present for DMII and weight Management f/u   Subjective     Discussed the use of AI scribe software for clinical note transcription with the patient, who gave verbal consent to proceed.  History of Present Illness   The patient, with a history of diabetes and hypertension, presents with concerns about difficulty losing weight. Despite dietary modifications and being on Mounjaro for about four months, the patient reports no significant weight loss. The patient's blood sugar levels have been well-controlled, ranging between 70 and 130. The patient also mentions a history of knee surgery, which has limited her ability to exercise. However, she expresses a desire to increase physical activity and plan to explore options at a local [employer]. The patient also mentions some thinning of hair, which she attributes to her blood pressure medication.         07/15/2023    1:41 PM 05/26/2022   11:23 AM 02/19/2022    1:34 PM  PHQ9 SCORE ONLY  PHQ-9 Total Score 0 17 0        07/15/2023    1:41 PM 05/26/2022   11:23 AM 02/19/2022    1:34 PM  Depression screen PHQ 2/9  Decreased Interest 0 2 0  Down, Depressed, Hopeless 0 2 0  PHQ - 2 Score 0 4 0  Altered sleeping  2   Tired, decreased energy  1   Change in appetite  2   Feeling bad or failure about yourself   3   Trouble concentrating  1   Moving slowly or fidgety/restless  2   Suicidal thoughts  2   PHQ-9 Score  17   Difficult doing work/chores  Very difficult       07/15/2023    1:42 PM 05/26/2022   11:24 AM 01/15/2022   11:36 AM  GAD 7 : Generalized Anxiety Score  Nervous, Anxious, on Edge 0 2 1  Control/stop worrying 0 3 1  Worry too much - different  things 0 2 2  Trouble relaxing 0 2 1  Restless 0 2 1  Easily annoyed or irritable 0 3 1  Afraid - awful might happen 0 2 0  Total GAD 7 Score 0 16 7  Anxiety Difficulty Not difficult at all Somewhat difficult Not difficult at all    Medications: Outpatient Medications Prior to Visit  Medication Sig   Accu-Chek Softclix Lancets lancets Use to check fasting blood sugars daily at as needed throughout the day   amLODipine (NORVASC) 10 MG tablet Take 1 tablet (10 mg total) by mouth daily.   blood glucose meter kit and supplies KIT Dispense based on patient and insurance preference. Check blood sugar twice a day   Blood Glucose Monitoring Suppl (ACCU-CHEK GUIDE) w/Device KIT Use to check fasting blood sugars daily at as needed throughout the day   Blood Pressure Monitoring (BLOOD PRESSURE CUFF) MISC 1 each by Does not apply route as needed.   conjugated estrogens (PREMARIN) vaginal cream Place 1/3 applicator every other night at bedtime   dapagliflozin propanediol (FARXIGA) 5 MG TABS tablet Take 1 tablet (5 mg total) by mouth daily.   estradiol (ESTRACE VAGINAL) 0.1 MG/GM vaginal cream Place 1 Applicatorful vaginally  at bedtime. Use nightly for 3 weeks, then 3 times/week as needed   gabapentin (NEURONTIN) 300 MG capsule Take 1 capsule (300 mg total) by mouth 2 (two) times daily.   glucose blood (ACCU-CHEK GUIDE) test strip Use to check fasting blood sugars daily at as needed throughout the day   hydrochlorothiazide (MICROZIDE) 12.5 MG capsule Take 1 capsule (12.5 mg total) by mouth daily.   losartan (COZAAR) 100 MG tablet Take 1 tablet (100 mg total) by mouth daily.   metFORMIN (GLUCOPHAGE) 500 MG tablet Take 1 tablet (500 mg total) by mouth 2 (two) times daily with a meal.   rosuvastatin (CRESTOR) 10 MG tablet Take 1 tablet (10 mg total) by mouth daily.   tiZANidine (ZANAFLEX) 2 MG tablet Take 1-2 tablets (2-4 mg total) by mouth every 12 (twelve) hours as needed for muscle spasms.    [DISCONTINUED] tirzepatide (MOUNJARO) 10 MG/0.5ML Pen Inject 10 mg into the skin once a week.   No facility-administered medications prior to visit.    Review of Systems  All other systems reviewed and are negative.  Except see HPI       Objective    BP 120/81 (BP Location: Left Arm, Patient Position: Sitting, Cuff Size: Large)   Pulse 87   Wt 266 lb 9.6 oz (120.9 kg)   LMP  (LMP Unknown)   SpO2 100%   BMI 40.54 kg/m     Physical Exam Vitals reviewed.  Constitutional:      General: She is not in acute distress.    Appearance: Normal appearance. She is well-developed. She is obese. She is not diaphoretic.  HENT:     Head: Normocephalic and atraumatic.  Eyes:     General: No scleral icterus.    Conjunctiva/sclera: Conjunctivae normal.  Neck:     Thyroid: No thyromegaly.  Cardiovascular:     Rate and Rhythm: Normal rate and regular rhythm.     Pulses: Normal pulses.     Heart sounds: Normal heart sounds. No murmur heard. Pulmonary:     Effort: Pulmonary effort is normal. No respiratory distress.     Breath sounds: Normal breath sounds. No wheezing, rhonchi or rales.  Musculoskeletal:     Cervical back: Neck supple.     Right lower leg: No edema.     Left lower leg: No edema.  Lymphadenopathy:     Cervical: No cervical adenopathy.  Skin:    General: Skin is warm and dry.     Findings: No rash.  Neurological:     Mental Status: She is alert and oriented to person, place, and time. Mental status is at baseline.  Psychiatric:        Mood and Affect: Mood normal.        Behavior: Behavior normal.      Results for orders placed or performed in visit on 07/15/23  POCT HgB A1C  Result Value Ref Range   Hemoglobin A1C 5.9 (A) 4.0 - 5.6 %   HbA1c POC (<> result, manual entry)     HbA1c, POC (prediabetic range)     HbA1c, POC (controlled diabetic range)      Assessment & Plan        Type 2 Diabetes Mellitus Stable blood glucose control with Mounjaro 10mg .  Patient reports no weight loss despite medication and dietary changes. Discussed potential side effects of increasing Mounjaro dose. -Increase Mounjaro to 12.5mg  daily. -Continue Metformin BID. - POCT HgB A1C - Urine microalbumin-creatinine with uACR - tirzepatide [facility])  12.5 MG/0.5ML Pen; Inject 12.5 mg into the skin once a week.  Dispense: 6 mL; Refill: 1 Continue taking farxiga 5 mg, metformin 500mg  BID  Immunization due - Flu vaccine trivalent PF, 6mos and older(Flulaval,Afluria,Fluarix,Fluzone)  Hypertension associated with diabetes (HCC) Chronic and stable Well controlled with current regimen. -Continue Amlodipine 10mg , losartan 100mg  and Hydrochlorothiazide 12.5mg . Checked her labs  Morbid obesity with BMI of 40.0-44.9, adult [facility]) Patient expresses concern about lack of weight loss despite dietary changes and medication. Discussed importance of exercise and potential benefits of aquatic exercises. -Encourage patient to engage in regular exercise, consider aquatic exercises. -Consider referral to [facility] for exercise program, provide note if needed.  Hyperlipidemia associated with type 2 diabetes mellitus (HCC) Chronic Stable on current regimen. -Continue Crestor 10mg  Advised low cholesterol diet and daily exercise Will FU  General Health Maintenance -Schedule annual wellness visit and subsequent physical exam. -Plan to check kidney function and lipids at next visit, patient to fast for 8 hours prior. -Encourage trial of green tea for natural diuretic properties. -Advise patient to check hair products for potential contributors to hair thinning.      Return in about 4 weeks (around 08/12/2023) for please, schedule AW and CPE a day after with labs.     The patient was advised to call back or seek an in-person evaluation if the symptoms worsen or if the condition fails to improve as anticipated.  I discussed the assessment and treatment plan with the patient. The patient  was provided an opportunity to ask questions and all were answered. The patient agreed with the plan and demonstrated an understanding of the instructions.  I, Debera Lat, PA-C have reviewed all documentation for this visit. The documentation on  07/15/23 for the exam, diagnosis, procedures, and orders are all accurate and complete.  Debera Lat, [facility], MMS [facility] [phone] (phone) [phone] (fax)  [facility]

## 2023-07-16 LAB — MICROALBUMIN / CREATININE URINE RATIO
Creatinine, Urine: 132.3 mg/dL
Microalb/Creat Ratio: 4 mg/g creat (ref 0–29)
Microalbumin, Urine: 5.6 ug/mL

## 2023-07-19 ENCOUNTER — Other Ambulatory Visit: Payer: Self-pay

## 2023-07-20 ENCOUNTER — Other Ambulatory Visit: Payer: Self-pay

## 2023-07-21 ENCOUNTER — Telehealth: Payer: Self-pay

## 2023-07-21 ENCOUNTER — Ambulatory Visit: Payer: 59 | Admitting: Student in an Organized Health Care Education/Training Program

## 2023-07-21 NOTE — Telephone Encounter (Signed)
Copied from CRM [id number]. Topic: General - Other >> Jul 21, 2023  3:46 PM Everette C wrote: Reason for CRM: Angie with [employer] has called to share that the patient is eligible for a 90 day supply of Rx #: [id number]  tirzepatide [facility]) 12.5 MG/0.5ML Pen [[id number]]  when prescribed   Please contact Angie further at [phone] if needed

## 2023-07-22 LAB — HM DIABETES EYE EXAM

## 2023-08-03 ENCOUNTER — Telehealth: Payer: Self-pay | Admitting: Physician Assistant

## 2023-08-03 NOTE — Telephone Encounter (Signed)
[facility] pharmacy faxed refill request for the following medications:   dapagliflozin propanediol (FARXIGA) 5 MG TABS tablet    Please advise

## 2023-08-05 ENCOUNTER — Other Ambulatory Visit: Payer: Self-pay | Admitting: Physician Assistant

## 2023-08-05 ENCOUNTER — Other Ambulatory Visit: Payer: Self-pay

## 2023-08-05 DIAGNOSIS — E119 Type 2 diabetes mellitus without complications: Secondary | ICD-10-CM

## 2023-08-05 MED ORDER — DAPAGLIFLOZIN PROPANEDIOL 5 MG PO TABS
5.0000 mg | ORAL_TABLET | Freq: Every day | ORAL | 1 refills | Status: DC
  Filled 2023-08-05 – 2023-08-09 (×2): qty 90, 90d supply, fill #0
  Filled 2023-11-09: qty 90, 90d supply, fill #1

## 2023-08-05 MED ORDER — ACCU-CHEK SOFTCLIX LANCETS MISC
3 refills | Status: AC
  Filled 2023-08-05: qty 100, 30d supply, fill #0
  Filled 2023-09-06: qty 100, 30d supply, fill #1
  Filled 2023-10-03: qty 100, 30d supply, fill #2
  Filled 2023-11-24: qty 100, 30d supply, fill #3

## 2023-08-05 MED ORDER — GLUCOSE BLOOD VI STRP
ORAL_STRIP | 4 refills | Status: AC
Start: 2023-08-05 — End: ?
  Filled 2023-08-05: qty 100, 30d supply, fill #0
  Filled 2023-09-06: qty 100, 30d supply, fill #1
  Filled 2023-11-25: qty 100, 30d supply, fill #2
  Filled 2023-12-20: qty 100, 30d supply, fill #3
  Filled 2023-12-23: qty 100, 30d supply, fill #0
  Filled 2024-01-17: qty 100, 30d supply, fill #1

## 2023-08-05 MED ORDER — ACCU-CHEK GUIDE W/DEVICE KIT
PACK | 0 refills | Status: DC
  Filled 2023-08-05: qty 1, 30d supply, fill #0

## 2023-08-05 MED FILL — Losartan Potassium Tab 100 MG: ORAL | 90 days supply | Qty: 90 | Fill #1 | Status: AC

## 2023-08-05 MED FILL — Rosuvastatin Calcium Tab 10 MG: ORAL | 90 days supply | Qty: 90 | Fill #1 | Status: AC

## 2023-08-05 NOTE — Telephone Encounter (Signed)
Patient wants all her medication to go to [facility] pharmacy. Reports not sure why [facility] is requesting.

## 2023-08-05 NOTE — Telephone Encounter (Signed)
Received another fax from [facility] pharmacy requesting    dapagliflozin propanediol (FARXIGA) 5 MG TABS tablet

## 2023-08-09 ENCOUNTER — Other Ambulatory Visit: Payer: Self-pay | Admitting: Physician Assistant

## 2023-08-09 ENCOUNTER — Other Ambulatory Visit: Payer: Self-pay

## 2023-08-09 DIAGNOSIS — I152 Hypertension secondary to endocrine disorders: Secondary | ICD-10-CM

## 2023-08-10 ENCOUNTER — Other Ambulatory Visit: Payer: Self-pay

## 2023-08-10 MED ORDER — HYDROCHLOROTHIAZIDE 12.5 MG PO CAPS
12.5000 mg | ORAL_CAPSULE | Freq: Every day | ORAL | 1 refills | Status: DC
  Filled 2023-08-10: qty 90, 90d supply, fill #0
  Filled 2023-11-24: qty 90, 90d supply, fill #1

## 2023-08-12 ENCOUNTER — Encounter: Payer: 59 | Admitting: Physician Assistant

## 2023-08-12 ENCOUNTER — Other Ambulatory Visit: Payer: Self-pay | Admitting: Physician Assistant

## 2023-08-12 DIAGNOSIS — E119 Type 2 diabetes mellitus without complications: Secondary | ICD-10-CM

## 2023-08-12 NOTE — Telephone Encounter (Signed)
Request is too soon for refill.  Requested Prescriptions  Pending Prescriptions Disp Refills   metFORMIN (GLUCOPHAGE) 500 MG tablet [Pharmacy Med Name: metFORMIN HCl 500 MG Oral Tablet] 200 tablet 2    Sig: TAKE 1 TABLET BY MOUTH TWICE  DAILY WITH MEALS     Endocrinology:  Diabetes - Biguanides Failed - 08/12/2023  5:35 AM      Failed - B12 Level in normal range and within 720 days    No results found for: "VITAMINB12"       Failed - CBC within normal limits and completed in the last 12 months    WBC  Date Value Ref Range Status  01/15/2022 8.3 3.8 - 10.8 Thousand/uL Final   RBC  Date Value Ref Range Status  01/15/2022 4.62 3.80 - 5.10 Million/uL Final   Hemoglobin  Date Value Ref Range Status  01/15/2022 13.8 11.7 - 15.5 g/dL Final   HGB  Date Value Ref Range Status  09/17/2014 12.6 12.0 - 16.0 g/dL Final   HCT  Date Value Ref Range Status  01/15/2022 41.1 35.0 - 45.0 % Final  09/17/2014 38.5 35.0 - 47.0 % Final   MCHC  Date Value Ref Range Status  01/15/2022 33.6 32.0 - 36.0 g/dL Final   [facility]  Date Value Ref Range Status  01/15/2022 29.9 27.0 - 33.0 pg Final   MCV  Date Value Ref Range Status  01/15/2022 89.0 80.0 - 100.0 fL Final  09/17/2014 91 80 - 100 fL Final   No results found for: "PLTCOUNTKUC", "LABPLAT", "POCPLA" RDW  Date Value Ref Range Status  01/15/2022 12.8 11.0 - 15.0 % Final  09/17/2014 14.7 (H) 11.5 - 14.5 % Final         Passed - Cr in normal range and within 360 days    Creat  Date Value Ref Range Status  01/15/2022 0.74 0.50 - 1.03 mg/dL Final   Creatinine, Ser  Date Value Ref Range Status  09/25/2022 0.75 0.57 - 1.00 mg/dL Final         Passed - HBA1C is between 0 and 7.9 and within 180 days    Hemoglobin A1C  Date Value Ref Range Status  07/15/2023 5.9 (A) 4.0 - 5.6 % Final   Hgb A1c MFr Bld  Date Value Ref Range Status  05/26/2022 5.9 (H) 4.8 - 5.6 % Final    Comment:             Prediabetes: 5.7 - 6.4          Diabetes:  >6.4          Glycemic control for adults with diabetes: <7.0          Passed - eGFR in normal range and within 360 days    EGFR (African American)  Date Value Ref Range Status  09/17/2014 >60 >78mL/min Final   GFR calc Af Amer  Date Value Ref Range Status  05/13/2019 >60 >60 mL/min Final   EGFR (Non-African Amer.)  Date Value Ref Range Status  09/17/2014 >60 >66mL/min Final    Comment:    eGFR values <44mL/min/1.73 m2 may be an indication of chronic kidney disease (CKD). Calculated eGFR, using the MRDR Study equation, is useful in  patients with stable renal function. The eGFR calculation will not be reliable in acutely ill patients when serum creatinine is changing rapidly. It is not useful in patients on dialysis. The eGFR calculation may not be applicable to patients at the low and high extremes  of body sizes, pregnant women, and vegetarians.    GFR, Estimated  Date Value Ref Range Status  05/06/2021 >60 >60 mL/min Final    Comment:    (NOTE) Calculated using the CKD-EPI Creatinine Equation (2021)    eGFR  Date Value Ref Range Status  09/25/2022 95 >59 mL/min/1.73 Final         Passed - Valid encounter within last 6 months    Recent Outpatient Visits           4 weeks ago Type 2 diabetes mellitus without complication, without long-term current use of insulin (HCC)   [place] [facility] [place], [place], PA-C   5 months ago Type 2 diabetes mellitus without complication, without long-term current use of insulin [facility])   [place] [facility] [place], [place], PA-C   10 months ago Hypertension associated with diabetes [facility])   [facility] Alfredia Ferguson, PA-C   11 months ago Type 2 diabetes mellitus without complication, without long-term current use of insulin [facility])   [place] [place], [place], PA-C   1 year ago Type 2 diabetes mellitus without complication, without  long-term current use of insulin (HCC)    [facility] Alfredia Ferguson, PA-C       Future Appointments             In 2 weeks Debera Lat, PA-C [facility]

## 2023-08-19 ENCOUNTER — Other Ambulatory Visit: Payer: Self-pay | Admitting: Physician Assistant

## 2023-08-19 DIAGNOSIS — R921 Mammographic calcification found on diagnostic imaging of breast: Secondary | ICD-10-CM

## 2023-08-20 ENCOUNTER — Telehealth: Payer: Self-pay | Admitting: Physician Assistant

## 2023-08-20 NOTE — Telephone Encounter (Signed)
Prescription was filled at [facility] pharmacy 08/05/2023. Called patient to verify and patient doesn't want her [facility].

## 2023-08-20 NOTE — Telephone Encounter (Signed)
[facility] is needing refills for Farxiga 5 mg.

## 2023-08-26 ENCOUNTER — Encounter: Payer: Self-pay | Admitting: Physician Assistant

## 2023-08-26 ENCOUNTER — Ambulatory Visit (INDEPENDENT_AMBULATORY_CARE_PROVIDER_SITE_OTHER): Payer: 59 | Admitting: Physician Assistant

## 2023-08-26 VITALS — BP 158/84 | HR 79 | Temp 98.4°F | Ht 67.0 in | Wt 262.1 lb

## 2023-08-26 DIAGNOSIS — E785 Hyperlipidemia, unspecified: Secondary | ICD-10-CM

## 2023-08-26 DIAGNOSIS — Z7984 Long term (current) use of oral hypoglycemic drugs: Secondary | ICD-10-CM | POA: Diagnosis not present

## 2023-08-26 DIAGNOSIS — E1169 Type 2 diabetes mellitus with other specified complication: Secondary | ICD-10-CM

## 2023-08-26 DIAGNOSIS — E1159 Type 2 diabetes mellitus with other circulatory complications: Secondary | ICD-10-CM

## 2023-08-26 DIAGNOSIS — I152 Hypertension secondary to endocrine disorders: Secondary | ICD-10-CM

## 2023-08-26 DIAGNOSIS — E119 Type 2 diabetes mellitus without complications: Secondary | ICD-10-CM

## 2023-08-26 DIAGNOSIS — Z6841 Body Mass Index (BMI) 40.0 and over, adult: Secondary | ICD-10-CM

## 2023-08-26 NOTE — Progress Notes (Signed)
Established patient visit  Patient: Denise Velez   DOB: 26-Feb-1966   57 y.o. Female  MRN: [id number] Visit Date: 08/26/2023  Today's healthcare provider: Debera Lat, PA-C   Chief Complaint  Patient presents with   Annual Exam   Subjective     Discussed the use of AI scribe software for clinical note transcription with the patient, who gave verbal consent to proceed.  History of Present Illness   The patient, currently on amlodipine and hydrochlorothiazide, presents with increased frequency of sniffles. They have recently joined a gym and are considering starting water aerobics classes. The patient has a history of medication non-compliance due to forgetfulness, specifically with their amlodipine medication. They have been making dietary changes and are on a weight loss program. The patient has a family history of cancer, but no personal history of cancer. They are due for a mammogram next month.           08/26/2023   10:41 AM 07/15/2023    1:41 PM 05/26/2022   11:23 AM  Depression screen PHQ 2/9  Decreased Interest 0 0 2  Down, Depressed, Hopeless 0 0 2  PHQ - 2 Score 0 0 4  Altered sleeping   2  Tired, decreased energy   1  Change in appetite   2  Feeling bad or failure about yourself    3  Trouble concentrating   1  Moving slowly or fidgety/restless   2  Suicidal thoughts   2  PHQ-9 Score   17  Difficult doing work/chores   Very difficult      07/15/2023    1:42 PM 05/26/2022   11:24 AM 01/15/2022   11:36 AM  GAD 7 : Generalized Anxiety Score  Nervous, Anxious, on Edge 0 2 1  Control/stop worrying 0 3 1  Worry too much - different things 0 2 2  Trouble relaxing 0 2 1  Restless 0 2 1  Easily annoyed or irritable 0 3 1  Afraid - awful might happen 0 2 0  Total GAD 7 Score 0 16 7  Anxiety Difficulty Not difficult at all Somewhat difficult Not difficult at all    Medications: Outpatient Medications Prior to Visit  Medication Sig   Accu-Chek Softclix Lancets  lancets Use to check fasting blood sugars daily at as needed throughout the day   amLODipine (NORVASC) 10 MG tablet Take 1 tablet (10 mg total) by mouth daily.   blood glucose meter kit and supplies KIT Dispense based on patient and insurance preference. Check blood sugar twice a day   Blood Glucose Monitoring Suppl (ACCU-CHEK GUIDE) w/Device KIT Use to check fasting blood sugars daily at as needed throughout the day   Blood Pressure Monitoring (BLOOD PRESSURE CUFF) MISC 1 each by Does not apply route as needed.   conjugated estrogens (PREMARIN) vaginal cream Place 1/3 applicator every other night at bedtime   dapagliflozin propanediol (FARXIGA) 5 MG TABS tablet Take 1 tablet (5 mg total) by mouth daily.   gabapentin (NEURONTIN) 300 MG capsule Take 1 capsule (300 mg total) by mouth 2 (two) times daily.   glucose blood (ACCU-CHEK GUIDE) test strip Use to check fasting blood sugars daily at as needed throughout the day   hydrochlorothiazide (MICROZIDE) 12.5 MG capsule Take 1 capsule (12.5 mg total) by mouth daily.   losartan (COZAAR) 100 MG tablet Take 1 tablet (100 mg total) by mouth daily.   metFORMIN (GLUCOPHAGE) 500 MG tablet Take 1 tablet (500 mg total)  by mouth 2 (two) times daily with a meal.   rosuvastatin (CRESTOR) 10 MG tablet Take 1 tablet (10 mg total) by mouth daily.   tirzepatide (MOUNJARO) 12.5 MG/0.5ML Pen Inject 12.5 mg into the skin once a week.   tiZANidine (ZANAFLEX) 2 MG tablet Take 1-2 tablets (2-4 mg total) by mouth every 12 (twelve) hours as needed for muscle spasms.   No facility-administered medications prior to visit.    Review of Systems  All other systems reviewed and are negative.  Except see HPI       Objective    BP (!) 158/84 (BP Location: Left Arm, Patient Position: Sitting, Cuff Size: Large)   Pulse 79   Temp 98.4 F (36.9 C)   Ht 5\' 7"  (1.702 m)   Wt 262 lb 1.6 oz (118.9 kg)   LMP  (LMP Unknown)   SpO2 100%   BMI 41.05 kg/m     Physical  Exam Vitals reviewed.  Constitutional:      General: She is not in acute distress.    Appearance: Normal appearance. She is well-developed. She is obese. She is not diaphoretic.  HENT:     Head: Normocephalic and atraumatic.  Eyes:     General: No scleral icterus.    Conjunctiva/sclera: Conjunctivae normal.  Neck:     Thyroid: No thyromegaly.  Cardiovascular:     Rate and Rhythm: Normal rate and regular rhythm.     Pulses: Normal pulses.     Heart sounds: Normal heart sounds. No murmur heard. Pulmonary:     Effort: Pulmonary effort is normal. No respiratory distress.     Breath sounds: Normal breath sounds. No wheezing, rhonchi or rales.  Musculoskeletal:     Cervical back: Neck supple.     Right lower leg: No edema.     Left lower leg: No edema.  Lymphadenopathy:     Cervical: No cervical adenopathy.  Skin:    General: Skin is warm and dry.     Findings: No rash.  Neurological:     Mental Status: She is alert and oriented to person, place, and time. Mental status is at baseline.  Psychiatric:        Mood and Affect: Mood normal.        Behavior: Behavior normal.      No results found for any visits on 08/26/23.  Assessment & Plan    1. Hyperlipidemia associated with type 2 diabetes mellitus (HCC) Chronic  Ordered - Comprehensive metabolic panel - CBC with Differential/Platelet - Lipid panel - TSH Continue crestor 10mg  Will follow-up  2. Morbid obesity with BMI of 40.0-44.9, adult (HCC) Weight Management Patient has joined a fitness center and is making efforts towards diet and exercise. Discussed the benefits of water aerobics and maintaining a healthy diet. -Encourage continuation of exercise and healthy diet. Continue using mounjaro 12.5mg  - Comprehensive metabolic panel - CBC with Differential/Platelet - Lipid panel - TSH Will follow-up  3. Hypertension associated with diabetes (HCC) Chronic and unstable She did not take her medication this  morning Stable on Amlodipine and Hydrochlorothiazide 12.5mg . -Continue current medications. - Comprehensive metabolic panel - CBC with Differential/Platelet - Lipid panel - TSH Will reassess after  receiving lab results  4. Type 2 diabetes mellitus without complication, without long-term current use of insulin (HCC) Chronic Ordered - Comprehensive metabolic panel - CBC with Differential/Platelet - Lipid panel - TSH Continue farxiga 5 mg and mounjaro 12.5 mg  Allergic Rhinitis Reports of increased sniffles. -  Advise use of nasal saline rinse, antihistamines, and Flonase.  General Health Maintenance -Order labs for liver function, lipid function, and TSH. -Schedule mammogram for next month. -Continue one a day multivitamins.      Return AW within a mo with Steward Drone, welcome to medicare /initial, physical afterwards with me.     The patient was advised to call back or seek an in-person evaluation if the symptoms worsen or if the condition fails to improve as anticipated.  I discussed the assessment and treatment plan with the patient. The patient was provided an opportunity to ask questions and all were answered. The patient agreed with the plan and demonstrated an understanding of the instructions.  I, Debera Lat, PA-C have reviewed all documentation for this visit. The documentation on 08/26/23 for the exam, diagnosis, procedures, and orders are all accurate and complete.  Debera Lat, [facility], MMS [facility] [phone] (phone) [phone] (fax)  [facility]

## 2023-08-27 LAB — COMPREHENSIVE METABOLIC PANEL
ALT: 19 [IU]/L (ref 0–32)
AST: 18 [IU]/L (ref 0–40)
Albumin: 4.4 g/dL (ref 3.8–4.9)
Alkaline Phosphatase: 125 [IU]/L — ABNORMAL HIGH (ref 44–121)
BUN/Creatinine Ratio: 15 (ref 9–23)
BUN: 12 mg/dL (ref 6–24)
Bilirubin Total: 0.4 mg/dL (ref 0.0–1.2)
CO2: 24 mmol/L (ref 20–29)
Calcium: 10 mg/dL (ref 8.7–10.2)
Chloride: 103 mmol/L (ref 96–106)
Creatinine, Ser: 0.78 mg/dL (ref 0.57–1.00)
Globulin, Total: 2.4 g/dL (ref 1.5–4.5)
Glucose: 91 mg/dL (ref 70–99)
Potassium: 4.7 mmol/L (ref 3.5–5.2)
Sodium: 141 mmol/L (ref 134–144)
Total Protein: 6.8 g/dL (ref 6.0–8.5)
eGFR: 90 mL/min/{1.73_m2} (ref >59)

## 2023-08-27 LAB — LIPID PANEL
Chol/HDL Ratio: 3 {ratio} (ref 0.0–4.4)
Cholesterol, Total: 177 mg/dL (ref 100–199)
HDL: 59 mg/dL (ref >39)
LDL Chol Calc (NIH): 102 mg/dL — ABNORMAL HIGH (ref 0–99)
Triglycerides: 86 mg/dL (ref 0–149)
VLDL Cholesterol Cal: 16 mg/dL (ref 5–40)

## 2023-08-27 LAB — CBC WITH DIFFERENTIAL/PLATELET
Basophils Absolute: 0.1 10*3/uL (ref 0.0–0.2)
Basos: 1 %
EOS (ABSOLUTE): 0.2 10*3/uL (ref 0.0–0.4)
Eos: 3 %
Hematocrit: 41 % (ref 34.0–46.6)
Hemoglobin: 13.4 g/dL (ref 11.1–15.9)
Immature Grans (Abs): 0 10*3/uL (ref 0.0–0.1)
Immature Granulocytes: 0 %
Lymphocytes Absolute: 3.1 10*3/uL (ref 0.7–3.1)
Lymphs: 37 %
MCH: 29.8 pg (ref 26.6–33.0)
MCHC: 32.7 g/dL (ref 31.5–35.7)
MCV: 91 fL (ref 79–97)
Monocytes Absolute: 0.5 10*3/uL (ref 0.1–0.9)
Monocytes: 6 %
Neutrophils Absolute: 4.5 10*3/uL (ref 1.4–7.0)
Neutrophils: 53 %
Platelets: 269 10*3/uL (ref 150–450)
RBC: 4.49 x10E6/uL (ref 3.77–5.28)
RDW: 13.2 % (ref 11.7–15.4)
WBC: 8.4 10*3/uL (ref 3.4–10.8)

## 2023-08-27 LAB — TSH: TSH: 0.712 u[IU]/mL (ref 0.450–4.500)

## 2023-08-27 NOTE — Progress Notes (Signed)
Please, check with [facility] if we could add GGT

## 2023-09-06 ENCOUNTER — Other Ambulatory Visit: Payer: Self-pay

## 2023-09-23 ENCOUNTER — Ambulatory Visit
Admission: RE | Admit: 2023-09-23 | Discharge: 2023-09-23 | Disposition: A | Discharge status: Home / Self Care | Payer: 59 | Source: Ambulatory Visit | Attending: Physician Assistant | Admitting: Physician Assistant

## 2023-09-23 DIAGNOSIS — R921 Mammographic calcification found on diagnostic imaging of breast: Secondary | ICD-10-CM

## 2023-09-27 ENCOUNTER — Other Ambulatory Visit: Payer: Self-pay

## 2023-10-07 LAB — SPECIMEN STATUS REPORT

## 2023-10-07 LAB — GAMMA GT: GGT: 12 [IU]/L (ref 0–60)

## 2023-10-26 ENCOUNTER — Encounter: Payer: 59 | Admitting: Physician Assistant

## 2023-10-27 MED FILL — Rosuvastatin Calcium Tab 10 MG: ORAL | 90 days supply | Qty: 90 | Fill #2 | Status: AC

## 2023-11-17 ENCOUNTER — Other Ambulatory Visit: Payer: Self-pay | Admitting: Physician Assistant

## 2023-11-17 DIAGNOSIS — I10 Essential (primary) hypertension: Secondary | ICD-10-CM

## 2023-11-21 ENCOUNTER — Other Ambulatory Visit: Payer: Self-pay

## 2023-11-21 ENCOUNTER — Other Ambulatory Visit: Payer: Self-pay | Admitting: Physician Assistant

## 2023-11-21 DIAGNOSIS — I10 Essential (primary) hypertension: Secondary | ICD-10-CM

## 2023-11-22 ENCOUNTER — Other Ambulatory Visit: Payer: Self-pay

## 2023-11-24 ENCOUNTER — Other Ambulatory Visit: Payer: Self-pay | Admitting: Physician Assistant

## 2023-11-24 ENCOUNTER — Other Ambulatory Visit: Payer: Self-pay

## 2023-11-24 ENCOUNTER — Other Ambulatory Visit: Payer: Self-pay | Admitting: Student in an Organized Health Care Education/Training Program

## 2023-11-24 DIAGNOSIS — G894 Chronic pain syndrome: Secondary | ICD-10-CM

## 2023-11-24 DIAGNOSIS — I10 Essential (primary) hypertension: Secondary | ICD-10-CM

## 2023-11-24 DIAGNOSIS — G8929 Other chronic pain: Secondary | ICD-10-CM

## 2023-11-24 DIAGNOSIS — E119 Type 2 diabetes mellitus without complications: Secondary | ICD-10-CM

## 2023-11-24 DIAGNOSIS — M5416 Radiculopathy, lumbar region: Secondary | ICD-10-CM

## 2023-11-24 NOTE — Telephone Encounter (Signed)
 Medication Refill -  Most Recent Primary Care Visit:  Provider: OSTWALT, JANNA  Department: [facility]  Visit Type: PHYSICAL  Date: 08/26/2023  Medication: Metformin  500mg /losartan  (COZAAR ) 100 MG tablet  Pt requesting 100 day supply of both of these meds  Has the patient contacted their pharmacy? yes (Agent: If yes, when and what did the pharmacy advise?)contact pcp  Is this the correct pharmacy for this prescription? yes  This is the patient's preferred pharmacy:  [facility] [address] [place] [address] Phone: [phone] Fax: [phone]   Has the prescription been filled recently? no  Is the patient out of the medication? no  Has the patient been seen for an appointment in the last year OR does the patient have an upcoming appointment? yes  Can we respond through MyChart? yes  Agent: Please be advised that Rx refills may take up to 3 business days. We ask that you follow-up with your pharmacy.

## 2023-11-25 ENCOUNTER — Other Ambulatory Visit: Payer: Self-pay

## 2023-11-25 ENCOUNTER — Other Ambulatory Visit: Payer: Self-pay | Admitting: Student in an Organized Health Care Education/Training Program

## 2023-11-25 DIAGNOSIS — G8929 Other chronic pain: Secondary | ICD-10-CM

## 2023-11-25 DIAGNOSIS — G894 Chronic pain syndrome: Secondary | ICD-10-CM

## 2023-11-25 DIAGNOSIS — M5416 Radiculopathy, lumbar region: Secondary | ICD-10-CM

## 2023-11-26 ENCOUNTER — Other Ambulatory Visit: Payer: Self-pay

## 2023-11-26 ENCOUNTER — Other Ambulatory Visit: Payer: Self-pay | Admitting: Physician Assistant

## 2023-11-26 DIAGNOSIS — M5416 Radiculopathy, lumbar region: Secondary | ICD-10-CM

## 2023-11-26 DIAGNOSIS — I10 Essential (primary) hypertension: Secondary | ICD-10-CM

## 2023-11-26 DIAGNOSIS — G894 Chronic pain syndrome: Secondary | ICD-10-CM

## 2023-11-26 DIAGNOSIS — G8929 Other chronic pain: Secondary | ICD-10-CM

## 2023-11-29 ENCOUNTER — Encounter: Payer: Self-pay | Admitting: Physician Assistant

## 2023-11-29 ENCOUNTER — Other Ambulatory Visit (HOSPITAL_COMMUNITY): Payer: Self-pay

## 2023-11-29 ENCOUNTER — Other Ambulatory Visit: Payer: Self-pay

## 2023-11-29 MED ORDER — LOSARTAN POTASSIUM 100 MG PO TABS
100.0000 mg | ORAL_TABLET | Freq: Every day | ORAL | 1 refills | Status: DC
  Filled 2023-11-29 – 2023-11-30 (×3): qty 90, 90d supply, fill #0
  Filled 2024-02-21: qty 90, 90d supply, fill #1

## 2023-11-29 MED ORDER — METFORMIN HCL 500 MG PO TABS
500.0000 mg | ORAL_TABLET | Freq: Two times a day (BID) | ORAL | 1 refills | Status: DC
  Filled 2023-11-29 – 2023-11-30 (×3): qty 180, 90d supply, fill #0
  Filled 2024-02-24: qty 180, 90d supply, fill #1

## 2023-11-29 NOTE — Telephone Encounter (Signed)
 Requested medications are due for refill today.  yes  Requested medications are on the active medications list.  yes  Last refill. 04/2023  Future visit scheduled.   no  Notes to clinic.  Rx signed by Manuelita [place]. Pt has recently seen Janna Ostwalt.    Requested Prescriptions  Pending Prescriptions Disp Refills   losartan  (COZAAR ) 100 MG tablet 90 tablet 1    Sig: Take 1 tablet (100 mg total) by mouth daily.     Cardiovascular:  Angiotensin Receptor Blockers Failed - 11/29/2023  7:55 AM      Failed - Last BP in normal range    BP Readings from Last 1 Encounters:  08/26/23 (!) 158/84         Passed - Cr in normal range and within 180 days    Creat  Date Value Ref Range Status  01/15/2022 0.74 0.50 - 1.03 mg/dL Final   Creatinine, Ser  Date Value Ref Range Status  08/26/2023 0.78 0.57 - 1.00 mg/dL Final         Passed - K in normal range and within 180 days    Potassium  Date Value Ref Range Status  08/26/2023 4.7 3.5 - 5.2 mmol/L Final  09/17/2014 4.3 3.5 - 5.1 mmol/L Final         Passed - Patient is not pregnant      Passed - Valid encounter within last 6 months    Recent Outpatient Visits           3 months ago Hyperlipidemia associated with type 2 diabetes mellitus (HCC)   [place] [facility] [place], [place], PA-C   4 months ago Type 2 diabetes mellitus without complication, without long-term current use of insulin (HCC)   [place] [facility] [place], [place], PA-C   9 months ago Type 2 diabetes mellitus without complication, without long-term current use of insulin (HCC)   [place] [facility] [place] Manuelita, PA-C   1 year ago Hypertension associated with diabetes [facility])   [place] [facility] [place] Manuelita, PA-C   1 year ago Type 2 diabetes mellitus without complication, without long-term current use of insulin (HCC)   [place] [facility] [place], [place], PA-C                metFORMIN  (GLUCOPHAGE ) 500 MG tablet 180 tablet 1    Sig: Take 1 tablet (500 mg total) by mouth 2 (two) times daily with a meal.     Endocrinology:  Diabetes - Biguanides Failed - 11/29/2023  7:55 AM      Failed - B12 Level in normal range and within 720 days    No results found for: VITAMINB12       Passed - Cr in normal range and within 360 days    Creat  Date Value Ref Range Status  01/15/2022 0.74 0.50 - 1.03 mg/dL Final   Creatinine, Ser  Date Value Ref Range Status  08/26/2023 0.78 0.57 - 1.00 mg/dL Final         Passed - HBA1C is between 0 and 7.9 and within 180 days    Hemoglobin A1C  Date Value Ref Range Status  07/15/2023 5.9 (A) 4.0 - 5.6 % Final   Hgb A1c MFr Bld  Date Value Ref Range Status  05/26/2022 5.9 (H) 4.8 - 5.6 % Final    Comment:             Prediabetes: 5.7 - 6.4  Diabetes: >6.4          Glycemic control for adults with diabetes: <7.0          Passed - eGFR in normal range and within 360 days    EGFR (African American)  Date Value Ref Range Status  09/17/2014 >60 >68mL/min Final   GFR calc Af Amer  Date Value Ref Range Status  05/13/2019 >60 >60 mL/min Final   EGFR (Non-African Amer.)  Date Value Ref Range Status  09/17/2014 >60 >28mL/min Final    Comment:    eGFR values <88mL/min/1.73 m2 may be an indication of chronic kidney disease (CKD). Calculated eGFR, using the MRDR Study equation, is useful in  patients with stable renal function. The eGFR calculation will not be reliable in acutely ill patients when serum creatinine is changing rapidly. It is not useful in patients on dialysis. The eGFR calculation may not be applicable to patients at the low and high extremes of body sizes, pregnant women, and vegetarians.    GFR, Estimated  Date Value Ref Range Status  05/06/2021 >60 >60 mL/min Final    Comment:    (NOTE) Calculated using the CKD-EPI Creatinine Equation (2021)    eGFR  Date Value Ref Range  Status  08/26/2023 90 >59 mL/min/1.73 Final         Passed - Valid encounter within last 6 months    Recent Outpatient Visits           3 months ago Hyperlipidemia associated with type 2 diabetes mellitus (HCC)   [place] [facility] [place], [place], PA-C   4 months ago Type 2 diabetes mellitus without complication, without long-term current use of insulin (HCC)   [place] [facility] [place], [place], PA-C   9 months ago Type 2 diabetes mellitus without complication, without long-term current use of insulin (HCC)   [place] [facility] Cyndi Shaver, PA-C   1 year ago Hypertension associated with diabetes [facility])   [place] [facility] Cyndi Shaver, PA-C   1 year ago Type 2 diabetes mellitus without complication, without long-term current use of insulin (HCC)   [place] [facility] [place], [place], PA-C              Passed - CBC within normal limits and completed in the last 12 months    WBC  Date Value Ref Range Status  08/26/2023 8.4 3.4 - 10.8 x10E3/uL Final  01/15/2022 8.3 3.8 - 10.8 Thousand/uL Final   RBC  Date Value Ref Range Status  08/26/2023 4.49 3.77 - 5.28 x10E6/uL Final  01/15/2022 4.62 3.80 - 5.10 Million/uL Final   Hemoglobin  Date Value Ref Range Status  08/26/2023 13.4 11.1 - 15.9 g/dL Final   Hematocrit  Date Value Ref Range Status  08/26/2023 41.0 34.0 - 46.6 % Final   MCHC  Date Value Ref Range Status  08/26/2023 32.7 31.5 - 35.7 g/dL Final  96/97/7976 66.3 32.0 - 36.0 g/dL Final   [facility]  Date Value Ref Range Status  08/26/2023 29.8 26.6 - 33.0 pg Final  01/15/2022 29.9 27.0 - 33.0 pg Final   MCV  Date Value Ref Range Status  08/26/2023 91 79 - 97 fL Final  09/17/2014 91 80 - 100 fL Final   No results found for: PLTCOUNTKUC, LABPLAT, POCPLA RDW  Date Value Ref Range Status  08/26/2023 13.2 11.7 - 15.4 % Final  09/17/2014 14.7 (H) 11.5 - 14.5  % Final

## 2023-11-30 ENCOUNTER — Other Ambulatory Visit (HOSPITAL_COMMUNITY): Payer: Self-pay

## 2023-11-30 ENCOUNTER — Other Ambulatory Visit: Payer: Self-pay

## 2023-12-02 NOTE — Telephone Encounter (Signed)
Pt was advised to check with dr. Cherylann Ratel for refill of gabapentin. BP med was refilled

## 2023-12-21 ENCOUNTER — Other Ambulatory Visit: Payer: Self-pay | Admitting: Physician Assistant

## 2023-12-21 DIAGNOSIS — I10 Essential (primary) hypertension: Secondary | ICD-10-CM

## 2023-12-22 ENCOUNTER — Other Ambulatory Visit: Payer: Self-pay

## 2023-12-22 MED ORDER — AMLODIPINE BESYLATE 10 MG PO TABS
10.0000 mg | ORAL_TABLET | Freq: Every day | ORAL | 1 refills | Status: DC
  Filled 2023-12-22 – 2023-12-23 (×2): qty 90, 90d supply, fill #0
  Filled 2024-03-20: qty 90, 90d supply, fill #1

## 2023-12-22 NOTE — Telephone Encounter (Signed)
 Requested Prescriptions  Pending Prescriptions Disp Refills   amLODipine  (NORVASC ) 10 MG tablet 90 tablet 1    Sig: Take 1 tablet (10 mg total) by mouth daily.     Cardiovascular: Calcium  Channel Blockers 2 Failed - 12/22/2023 12:58 PM      Failed - Last BP in normal range    BP Readings from Last 1 Encounters:  08/26/23 (!) 158/84         Passed - Last Heart Rate in normal range    Pulse Readings from Last 1 Encounters:  08/26/23 79         Passed - Valid encounter within last 6 months    Recent Outpatient Visits           3 months ago Hyperlipidemia associated with type 2 diabetes mellitus (HCC)   [place] [facility] [place], [place], PA-C   5 months ago Type 2 diabetes mellitus without complication, without long-term current use of insulin (HCC)   [place] [facility] [place], [place], PA-C   10 months ago Type 2 diabetes mellitus without complication, without long-term current use of insulin [facility])   [place] [facility] Cyndi Shaver, PA-C   1 year ago Hypertension associated with diabetes [facility])   [place] [facility] Cyndi Shaver, PA-C   1 year ago Type 2 diabetes mellitus without complication, without long-term current use of insulin [facility])   [facility] Cyndi Shaver, PA-C

## 2023-12-23 ENCOUNTER — Encounter: Payer: Self-pay | Admitting: Physician Assistant

## 2023-12-23 ENCOUNTER — Other Ambulatory Visit: Payer: Self-pay

## 2023-12-23 ENCOUNTER — Other Ambulatory Visit: Payer: Self-pay | Admitting: Physician Assistant

## 2023-12-23 ENCOUNTER — Other Ambulatory Visit (HOSPITAL_COMMUNITY): Payer: Self-pay

## 2023-12-23 DIAGNOSIS — E119 Type 2 diabetes mellitus without complications: Secondary | ICD-10-CM

## 2023-12-23 NOTE — Telephone Encounter (Signed)
 Requested medication (s) are due for refill today: Yes  Requested medication (s) are on the active medication list: Yes  Last refill:  07/15/23  Future visit scheduled: No  Notes to clinic:  Unable to refill due to no refill protocol for this medication.      Requested Prescriptions  Pending Prescriptions Disp Refills   tirzepatide  (MOUNJARO ) 12.5 MG/0.5ML Pen 6 mL 1    Sig: Inject 12.5 mg into the skin once a week.     Off-Protocol Failed - 12/23/2023  1:40 PM      Failed - Medication not assigned to a protocol, review manually.      Passed - Valid encounter within last 12 months    Recent Outpatient Visits           3 months ago Hyperlipidemia associated with type 2 diabetes mellitus (HCC)   [place] [facility] [place], [place], PA-C   5 months ago Type 2 diabetes mellitus without complication, without long-term current use of insulin (HCC)   [place] [facility] [place], [place], PA-C   10 months ago Type 2 diabetes mellitus without complication, without long-term current use of insulin [facility])   [place] [facility] Cyndi Shaver, PA-C   1 year ago Hypertension associated with diabetes [facility])   [place] [facility] Cyndi Shaver, PA-C   1 year ago Type 2 diabetes mellitus without complication, without long-term current use of insulin [facility])   [facility] Cyndi Shaver, PA-C

## 2023-12-24 ENCOUNTER — Other Ambulatory Visit (HOSPITAL_COMMUNITY): Payer: Self-pay

## 2023-12-24 MED ORDER — TIRZEPATIDE 12.5 MG/0.5ML ~~LOC~~ SOAJ
12.5000 mg | SUBCUTANEOUS | 1 refills | Status: DC
  Filled 2023-12-24: qty 6, 84d supply, fill #0
  Filled 2024-03-13: qty 6, 84d supply, fill #1

## 2024-01-31 ENCOUNTER — Other Ambulatory Visit (HOSPITAL_COMMUNITY): Payer: Self-pay

## 2024-01-31 MED FILL — Rosuvastatin Calcium Tab 10 MG: ORAL | 90 days supply | Qty: 90 | Fill #3 | Status: AC

## 2024-02-08 ENCOUNTER — Other Ambulatory Visit: Payer: Self-pay | Admitting: Physician Assistant

## 2024-02-08 DIAGNOSIS — Z1231 Encounter for screening mammogram for malignant neoplasm of breast: Secondary | ICD-10-CM

## 2024-02-08 DIAGNOSIS — R921 Mammographic calcification found on diagnostic imaging of breast: Secondary | ICD-10-CM

## 2024-02-16 ENCOUNTER — Other Ambulatory Visit: Payer: Self-pay

## 2024-02-16 ENCOUNTER — Other Ambulatory Visit: Payer: Self-pay | Admitting: Physician Assistant

## 2024-02-16 DIAGNOSIS — I152 Hypertension secondary to endocrine disorders: Secondary | ICD-10-CM

## 2024-02-16 MED ORDER — HYDROCHLOROTHIAZIDE 12.5 MG PO CAPS
12.5000 mg | ORAL_CAPSULE | Freq: Every day | ORAL | 0 refills | Status: DC
  Filled 2024-02-16 – 2024-02-21 (×3): qty 90, 90d supply, fill #0

## 2024-02-21 ENCOUNTER — Other Ambulatory Visit (HOSPITAL_COMMUNITY): Payer: Self-pay

## 2024-02-21 ENCOUNTER — Other Ambulatory Visit: Payer: Self-pay

## 2024-02-21 ENCOUNTER — Other Ambulatory Visit: Payer: Self-pay | Admitting: Physician Assistant

## 2024-02-21 DIAGNOSIS — E119 Type 2 diabetes mellitus without complications: Secondary | ICD-10-CM

## 2024-02-22 ENCOUNTER — Other Ambulatory Visit (HOSPITAL_COMMUNITY): Payer: Self-pay

## 2024-02-22 ENCOUNTER — Other Ambulatory Visit: Payer: Self-pay

## 2024-02-22 MED ORDER — DAPAGLIFLOZIN PROPANEDIOL 5 MG PO TABS
5.0000 mg | ORAL_TABLET | Freq: Every day | ORAL | 1 refills | Status: DC
Start: 2024-02-22 — End: 2024-08-23
  Filled 2024-02-22: qty 90, 90d supply, fill #0
  Filled 2024-05-17: qty 90, 90d supply, fill #1

## 2024-02-24 ENCOUNTER — Other Ambulatory Visit: Payer: Self-pay

## 2024-02-28 NOTE — Progress Notes (Signed)
 [facility] Quality Team Note  Name: Denise Velez Date of Birth: 10/25/1966 MRN: [id number] Date: 02/28/2024  [facility] Quality Team has reviewed this patient's chart, please see recommendations below:  [facility] Quality Other; (Patient is due for KED labs and A1C. Patient needs URINE microalbumin/creatinine ratio as well as eGFR completed. Patient also needs hemoglobin A1c check with a value less than or equal to 9.0 for gap closure. Please address at 03/13/2024 office visit)

## 2024-03-01 NOTE — Progress Notes (Signed)
 Please see the message below.

## 2024-03-14 ENCOUNTER — Other Ambulatory Visit (HOSPITAL_COMMUNITY): Payer: Self-pay

## 2024-03-14 ENCOUNTER — Ambulatory Visit (INDEPENDENT_AMBULATORY_CARE_PROVIDER_SITE_OTHER): Admitting: Physician Assistant

## 2024-03-14 ENCOUNTER — Encounter: Payer: Self-pay | Admitting: Physician Assistant

## 2024-03-14 ENCOUNTER — Other Ambulatory Visit: Payer: Self-pay

## 2024-03-14 VITALS — BP 127/84 | HR 91 | Resp 16 | Ht 68.0 in | Wt 234.0 lb

## 2024-03-14 DIAGNOSIS — E049 Nontoxic goiter, unspecified: Secondary | ICD-10-CM | POA: Diagnosis not present

## 2024-03-14 DIAGNOSIS — M5416 Radiculopathy, lumbar region: Secondary | ICD-10-CM | POA: Diagnosis not present

## 2024-03-14 DIAGNOSIS — E119 Type 2 diabetes mellitus without complications: Secondary | ICD-10-CM

## 2024-03-14 DIAGNOSIS — Z7984 Long term (current) use of oral hypoglycemic drugs: Secondary | ICD-10-CM

## 2024-03-14 DIAGNOSIS — E669 Obesity, unspecified: Secondary | ICD-10-CM

## 2024-03-14 DIAGNOSIS — E1159 Type 2 diabetes mellitus with other circulatory complications: Secondary | ICD-10-CM | POA: Diagnosis not present

## 2024-03-14 DIAGNOSIS — Z7985 Long-term (current) use of injectable non-insulin antidiabetic drugs: Secondary | ICD-10-CM

## 2024-03-14 DIAGNOSIS — G8929 Other chronic pain: Secondary | ICD-10-CM

## 2024-03-14 DIAGNOSIS — I152 Hypertension secondary to endocrine disorders: Secondary | ICD-10-CM

## 2024-03-14 MED ORDER — TIRZEPATIDE 15 MG/0.5ML ~~LOC~~ SOAJ
15.0000 mg | SUBCUTANEOUS | 1 refills | Status: DC
  Filled 2024-03-14: qty 2, 28d supply, fill #0
  Filled 2024-03-14: qty 6, 84d supply, fill #0
  Filled 2024-05-13 – 2024-05-16 (×2): qty 6, 84d supply, fill #1

## 2024-03-14 NOTE — Progress Notes (Signed)
 " Established patient visit  Patient: Denise Velez   DOB: 1966-09-07   58 y.o. Female  MRN: [id number] Visit Date: 03/14/2024  Today's healthcare provider: Jolynn Spencer, PA-C   Chief Complaint  Patient presents with   Weight Loss    Wt loss and check up    Subjective     HPI     Weight Loss    Additional comments: Wt loss and check up       Last edited by Marylen Odella CROME, CMA on 03/14/2024  1:15 PM.       Discussed the use of AI scribe software for clinical note transcription with the patient, who gave verbal consent to proceed.  History of Present Illness Denise Velez is a 58 year old female with diabetes and hypertension who presents for blood work and medication management.  Her hemoglobin A1c has increased to 9 from 5.9 eight months ago. She is on Farxiga  5 mg daily and Mounjaro  12.5 mg for diabetes, with a desire to increase Mounjaro  to 15 mg. She has lost 10 pounds since her last visit and switched from Ozempic  to Mounjaro  due to decreased efficacy. No symptoms of thyroid or calcium  issues.  For hypertension, she takes amlodipine  10 mg daily and stopped hydrochlorothiazide  12.5 mg due to itching and feeling unwell. Home blood pressure readings are around 116/16, lower than today's reading.  She follows a high protein, low carbohydrate diet, enjoys fruits, and exercises by walking on a treadmill for 15 minutes. She plans to start water aerobics. She manages back problems with weight loss and exercise. No shortness of breath, chest pain, palpitations, vision changes, dizziness, neurological symptoms, cold intolerance, dry skin, or menstrual irregularities.  Pt was advised by [facility] team: To be checked for KED labs and A1C. Pt is still uptodate with  URINE microalbumin/creatinine ratio    03/14/2024    1:28 PM 08/26/2023   10:41 AM 07/15/2023    1:41 PM  Depression screen PHQ 2/9  Decreased Interest 0 0 0  Down, Depressed, Hopeless 1 0 0  PHQ - 2 Score 1 0 0  Altered  sleeping 0    Tired, decreased energy 0    Change in appetite 0    Feeling bad or failure about yourself  0    Trouble concentrating 0    Moving slowly or fidgety/restless 0    Suicidal thoughts 0    PHQ-9 Score 1    Difficult doing work/chores Not difficult at all        03/14/2024    1:29 PM 07/15/2023    1:42 PM 05/26/2022   11:24 AM 01/15/2022   11:36 AM  GAD 7 : Generalized Anxiety Score  Nervous, Anxious, on Edge 0 0 2 1  Control/stop worrying 1 0 3 1  Worry too much - different things 1 0 2 2  Trouble relaxing 0 0 2 1  Restless 0 0 2 1  Easily annoyed or irritable 1 0 3 1  Afraid - awful might happen 0 0 2 0  Total GAD 7 Score 3 0 16 7  Anxiety Difficulty Not difficult at all Not difficult at all Somewhat difficult Not difficult at all    Medications: Outpatient Medications Prior to Visit  Medication Sig Note   Accu-Chek Softclix Lancets lancets Use to check fasting blood sugars daily at as needed throughout the day    amLODipine  (NORVASC ) 10 MG tablet Take 1 tablet (10 mg total) by mouth daily.  blood glucose meter kit and supplies KIT Dispense based on patient and insurance preference. Check blood sugar twice a day    dapagliflozin  propanediol (FARXIGA ) 5 MG TABS tablet Take 1 tablet (5 mg total) by mouth daily.    glucose blood test strip Use to check fasting blood sugars daily and as needed throughout the day    losartan  (COZAAR ) 100 MG tablet Take 1 tablet (100 mg total) by mouth daily.    metFORMIN  (GLUCOPHAGE ) 500 MG tablet Take 1 tablet (500 mg total) by mouth 2 (two) times daily with a meal.    rosuvastatin  (CRESTOR ) 10 MG tablet Take 1 tablet (10 mg total) by mouth daily.    [DISCONTINUED] tirzepatide  (MOUNJARO ) 12.5 MG/0.5ML Pen Inject 12.5 mg into the skin once a week.    Blood Glucose Monitoring Suppl (ACCU-CHEK GUIDE) w/Device KIT Use to check fasting blood sugars daily at as needed throughout the day    Blood Pressure Monitoring (BLOOD PRESSURE CUFF) MISC 1  each by Does not apply route as needed.    conjugated estrogens  (PREMARIN ) vaginal cream Place 1/3 applicator every other night at bedtime    gabapentin  (NEURONTIN ) 300 MG capsule Take 1 capsule (300 mg total) by mouth 2 (two) times daily.    hydrochlorothiazide  (MICROZIDE ) 12.5 MG capsule Take 1 capsule (12.5 mg total) by mouth daily. (Patient not taking: Reported on 03/14/2024) 03/14/2024: Pt states this medication makes her itch and feels funny   tiZANidine  (ZANAFLEX ) 2 MG tablet Take 1-2 tablets (2-4 mg total) by mouth every 12 (twelve) hours as needed for muscle spasms.    No facility-administered medications prior to visit.    Review of Systems All negative Except see HPI       Objective    BP 127/84 (BP Location: Left Arm, Patient Position: Sitting, Cuff Size: Large)   Pulse 91   Resp 16   Ht 5' 8 (1.727 m)   Wt 234 lb (106.1 kg)   LMP  (LMP Unknown)   SpO2 99%   BMI 35.58 kg/m     Physical Exam Vitals reviewed.  Constitutional:      General: She is not in acute distress.    Appearance: Normal appearance. She is well-developed. She is obese. She is not diaphoretic.  HENT:     Head: Normocephalic and atraumatic.  Eyes:     General: No scleral icterus.    Conjunctiva/sclera: Conjunctivae normal.  Neck:     Thyroid: No thyromegaly.  Cardiovascular:     Rate and Rhythm: Normal rate and regular rhythm.     Pulses: Normal pulses.     Heart sounds: Normal heart sounds. No murmur heard. Pulmonary:     Effort: Pulmonary effort is normal. No respiratory distress.     Breath sounds: Normal breath sounds. No wheezing, rhonchi or rales.  Musculoskeletal:     Cervical back: Neck supple.     Right lower leg: No edema.     Left lower leg: No edema.  Lymphadenopathy:     Cervical: No cervical adenopathy.  Skin:    General: Skin is warm and dry.     Findings: No rash.  Neurological:     Mental Status: She is alert and oriented to person, place, and time. Mental status is  at baseline.  Psychiatric:        Mood and Affect: Mood normal.        Behavior: Behavior normal.      No results found for any visits on  03/14/24.      Assessment and Plan Assessment & Plan Type 2 diabetes mellitus with hyperglycemia Chronic A1c previously 5.9, recent concerns about increased levels. Mounjaro  dose to be increased due to weight loss plateau. Discussed potential gastrointestinal side effects. Emphasized high protein, low carbohydrate diet. - Order blood work to assess A1c and kidney function. - Increase Mounjaro  to 15 mg. - Monitor for gastrointestinal side effects. - Refer to nutritionist for dietary guidance.  Obesity Chronic and associated with DMII, HTN, HLP Lost 10 pounds since last visit. Mounjaro  aiding weight loss. Discussed diet and exercise importance. Potential insurance issues with medication noted. - Continue Mounjaro  as prescribed. - Encourage regular exercise, including water aerobics. - Refer to nutritionist for dietary guidance.  Hypertension Chronic Blood pressure well-controlled with amlodipine  10. Hydrochlorothiazide  discontinued due to adverse effects. Home monitoring shows typical readings around 116/60. - Continue amlodipine  10 mg daily. - Monitor blood pressure regularly at home. Encouraged to continue with low salt diet and regular exercise as tolerated  Chronic back pain due to L4, L5, S1 issues Chronic back pain persists. Weight loss and exercise beneficial. Water aerobics recommended. - Encourage water aerobics for low-impact exercise. - Continue weight management efforts.  Enlarged thyroid without nodules Enlarged thyroid noted, no nodules. Ultrasound needed to confirm status. TSH levels monitored regularly. No symptoms reported. - Order thyroid ultrasound to confirm status.  General Health Maintenance Discussed importance of vaccinations and routine screenings. Advised to consider pneumonia and shingles vaccines. - Consider  pneumonia and shingles vaccines. - Schedule annual wellness visit in three months.  Type 2 diabetes mellitus without complication, without long-term current use of insulin (HCC) (Primary) - tirzepatide  (MOUNJARO ) 15 MG/0.5ML Pen; Inject 15 mg into the skin once a week.  Dispense: 6 mL; Refill: 1 - Lipid panel - Hemoglobin A1c - CBC with Differential/Platelet - Comprehensive metabolic panel with GFR - TSH - Urinalysis, microscopic only  Goiter - US  THYROID; Future  Orders Placed This Encounter  Procedures   US  THYROID    Standing Status:   Future    Expiration Date:   03/14/2025    Reason for Exam (SYMPTOM  OR DIAGNOSIS REQUIRED):   goiter    Preferred imaging location?:   [facility]   Lipid panel    Has the patient fasted?:   Yes   Hemoglobin A1c   CBC with Differential/Platelet   Comprehensive metabolic panel with GFR    Has the patient fasted?:   Yes   TSH   Urinalysis, microscopic only    Return in about 3 months (around 06/13/2024) for AW, chronic disease f/u.   The patient was advised to call back or seek an in-person evaluation if the symptoms worsen or if the condition fails to improve as anticipated.  I discussed the assessment and treatment plan with the patient. The patient was provided an opportunity to ask questions and all were answered. The patient agreed with the plan and demonstrated an understanding of the instructions.  I, Channin Agustin, PA-C have reviewed all documentation for this visit. The documentation on 03/14/2024  for the exam, diagnosis, procedures, and orders are all accurate and complete.  Jolynn Spencer, [facility], MMS [facility] [phone] (phone) [phone] (fax)  [facility] "

## 2024-03-15 ENCOUNTER — Other Ambulatory Visit: Payer: Self-pay

## 2024-03-15 DIAGNOSIS — E049 Nontoxic goiter, unspecified: Secondary | ICD-10-CM | POA: Insufficient documentation

## 2024-03-15 DIAGNOSIS — E669 Obesity, unspecified: Secondary | ICD-10-CM | POA: Insufficient documentation

## 2024-03-16 ENCOUNTER — Encounter: Payer: Self-pay | Admitting: Physician Assistant

## 2024-03-17 NOTE — Telephone Encounter (Signed)
 Please see the pt response below

## 2024-03-23 ENCOUNTER — Encounter (HOSPITAL_COMMUNITY): Payer: Self-pay

## 2024-03-30 ENCOUNTER — Ambulatory Visit
Admission: RE | Admit: 2024-03-30 | Discharge: 2024-03-30 | Disposition: A | Discharge status: Home / Self Care | Source: Ambulatory Visit | Attending: Physician Assistant | Admitting: Physician Assistant

## 2024-03-30 DIAGNOSIS — Z1231 Encounter for screening mammogram for malignant neoplasm of breast: Secondary | ICD-10-CM | POA: Diagnosis present

## 2024-03-30 DIAGNOSIS — R921 Mammographic calcification found on diagnostic imaging of breast: Secondary | ICD-10-CM

## 2024-03-31 LAB — COMPREHENSIVE METABOLIC PANEL WITH GFR
ALT: 16 IU/L (ref 0–32)
AST: 15 IU/L (ref 0–40)
Albumin: 4.2 g/dL (ref 3.8–4.9)
Alkaline Phosphatase: 139 IU/L — ABNORMAL HIGH (ref 44–121)
BUN/Creatinine Ratio: 11 (ref 9–23)
BUN: 7 mg/dL (ref 6–24)
Bilirubin Total: 0.4 mg/dL (ref 0.0–1.2)
CO2: 21 mmol/L (ref 20–29)
Calcium: 9.4 mg/dL (ref 8.7–10.2)
Chloride: 103 mmol/L (ref 96–106)
Creatinine, Ser: 0.62 mg/dL (ref 0.57–1.00)
Globulin, Total: 2 g/dL (ref 1.5–4.5)
Glucose: 80 mg/dL (ref 70–99)
Potassium: 4.3 mmol/L (ref 3.5–5.2)
Sodium: 140 mmol/L (ref 134–144)
Total Protein: 6.2 g/dL (ref 6.0–8.5)
eGFR: 105 mL/min/{1.73_m2} (ref >59)

## 2024-03-31 LAB — CBC WITH DIFFERENTIAL/PLATELET
Basophils Absolute: 0 10*3/uL (ref 0.0–0.2)
Basos: 0 %
EOS (ABSOLUTE): 0.4 10*3/uL (ref 0.0–0.4)
Eos: 4 %
Hematocrit: 39.6 % (ref 34.0–46.6)
Hemoglobin: 13 g/dL (ref 11.1–15.9)
Immature Grans (Abs): 0 10*3/uL (ref 0.0–0.1)
Immature Granulocytes: 0 %
Lymphocytes Absolute: 2.8 10*3/uL (ref 0.7–3.1)
Lymphs: 28 %
MCH: 30.2 pg (ref 26.6–33.0)
MCHC: 32.8 g/dL (ref 31.5–35.7)
MCV: 92 fL (ref 79–97)
Monocytes Absolute: 0.5 10*3/uL (ref 0.1–0.9)
Monocytes: 5 %
Neutrophils Absolute: 6.3 10*3/uL (ref 1.4–7.0)
Neutrophils: 63 %
Platelets: 275 10*3/uL (ref 150–450)
RBC: 4.3 x10E6/uL (ref 3.77–5.28)
RDW: 14.1 % (ref 11.7–15.4)
WBC: 10.1 10*3/uL (ref 3.4–10.8)

## 2024-03-31 LAB — HEMOGLOBIN A1C
Est. average glucose Bld gHb Est-mCnc: 103 mg/dL
Hgb A1c MFr Bld: 5.2 % (ref 4.8–5.6)

## 2024-03-31 LAB — LIPID PANEL
Chol/HDL Ratio: 3.4 ratio (ref 0.0–4.4)
Cholesterol, Total: 177 mg/dL (ref 100–199)
HDL: 52 mg/dL (ref >39)
LDL Chol Calc (NIH): 109 mg/dL — ABNORMAL HIGH (ref 0–99)
Triglycerides: 84 mg/dL (ref 0–149)
VLDL Cholesterol Cal: 16 mg/dL (ref 5–40)

## 2024-03-31 LAB — URINALYSIS, MICROSCOPIC ONLY
Bacteria, UA: NONE SEEN
Casts: NONE SEEN /LPF
WBC, UA: NONE SEEN /HPF (ref 0–5)

## 2024-03-31 LAB — TSH: TSH: 0.973 u[IU]/mL (ref 0.450–4.500)

## 2024-04-03 ENCOUNTER — Ambulatory Visit: Payer: Self-pay | Admitting: Physician Assistant

## 2024-04-03 ENCOUNTER — Other Ambulatory Visit: Payer: Self-pay | Admitting: Physician Assistant

## 2024-04-03 DIAGNOSIS — E1169 Type 2 diabetes mellitus with other specified complication: Secondary | ICD-10-CM

## 2024-04-03 NOTE — Progress Notes (Signed)
 Please check with [facility] if GGT test could be added

## 2024-04-04 ENCOUNTER — Other Ambulatory Visit (HOSPITAL_COMMUNITY): Payer: Self-pay

## 2024-04-05 ENCOUNTER — Other Ambulatory Visit: Payer: Self-pay | Admitting: Physician Assistant

## 2024-04-05 ENCOUNTER — Other Ambulatory Visit: Payer: Self-pay

## 2024-04-05 DIAGNOSIS — E1169 Type 2 diabetes mellitus with other specified complication: Secondary | ICD-10-CM

## 2024-04-05 MED ORDER — ROSUVASTATIN CALCIUM 10 MG PO TABS
15.0000 mg | ORAL_TABLET | Freq: Every day | ORAL | 3 refills | Status: DC
Start: 2024-04-05 — End: 2024-04-11
  Filled 2024-04-05: qty 180, fill #0
  Filled 2024-04-07 – 2024-04-08 (×3): qty 90, 90d supply, fill #0

## 2024-04-07 ENCOUNTER — Other Ambulatory Visit (HOSPITAL_COMMUNITY): Payer: Self-pay

## 2024-04-07 ENCOUNTER — Encounter: Payer: Self-pay | Admitting: Physician Assistant

## 2024-04-07 ENCOUNTER — Telehealth: Payer: Self-pay

## 2024-04-07 LAB — GAMMA GT: GGT: 12 IU/L (ref 0–60)

## 2024-04-07 LAB — SPECIMEN STATUS REPORT

## 2024-04-07 NOTE — Telephone Encounter (Signed)
 Copied from CRM [id number]. Topic: Clinical - Medication Question >> Apr 07, 2024 10:48 AM Oddis Bench wrote: Reason for CRM: Patient is calling about rosuvastatin  (CRESTOR ) 10 MG tablet she states that the Dr wants her to take 1.5 pills per day but her script is only written for 1 tab per day and the insurance will not pay for the additional tablets she will have to come out of pocket. She is wanting to know if there can be a new script sent into pharmacy for the correct amount she is to take per day or that the Dr lets her know what she should do.Patient is requesting a call back

## 2024-04-08 ENCOUNTER — Other Ambulatory Visit (HOSPITAL_COMMUNITY): Payer: Self-pay

## 2024-04-11 ENCOUNTER — Other Ambulatory Visit: Payer: Self-pay | Admitting: Physician Assistant

## 2024-04-11 ENCOUNTER — Other Ambulatory Visit: Payer: Self-pay

## 2024-04-11 ENCOUNTER — Other Ambulatory Visit (HOSPITAL_COMMUNITY): Payer: Self-pay

## 2024-04-11 DIAGNOSIS — E1169 Type 2 diabetes mellitus with other specified complication: Secondary | ICD-10-CM

## 2024-04-11 MED ORDER — ROSUVASTATIN CALCIUM 10 MG PO TABS
ORAL_TABLET | Freq: Every day | ORAL | 3 refills | Status: DC
  Filled 2024-04-11: qty 180, fill #0
  Filled 2024-04-11: qty 180, 90d supply, fill #0
  Filled 2024-04-12 – 2024-04-23 (×3): qty 180, fill #0
  Filled 2024-04-25 – 2024-05-17 (×5): qty 180, 90d supply, fill #0
  Filled 2024-05-22: qty 180, fill #0
  Filled 2024-05-31 – 2024-06-05 (×2): qty 180, 90d supply, fill #0

## 2024-04-11 NOTE — Telephone Encounter (Signed)
 Advised

## 2024-04-12 ENCOUNTER — Other Ambulatory Visit: Payer: Self-pay

## 2024-04-13 ENCOUNTER — Other Ambulatory Visit: Payer: Self-pay

## 2024-04-24 ENCOUNTER — Other Ambulatory Visit: Payer: Self-pay

## 2024-04-25 ENCOUNTER — Other Ambulatory Visit (HOSPITAL_COMMUNITY): Payer: Self-pay

## 2024-04-25 ENCOUNTER — Other Ambulatory Visit: Payer: Self-pay

## 2024-05-08 ENCOUNTER — Other Ambulatory Visit (HOSPITAL_COMMUNITY): Payer: Self-pay

## 2024-05-08 ENCOUNTER — Other Ambulatory Visit: Payer: Self-pay

## 2024-05-12 ENCOUNTER — Other Ambulatory Visit: Payer: Self-pay

## 2024-05-12 ENCOUNTER — Other Ambulatory Visit (HOSPITAL_COMMUNITY): Payer: Self-pay

## 2024-05-13 ENCOUNTER — Other Ambulatory Visit (HOSPITAL_COMMUNITY): Payer: Self-pay

## 2024-05-13 ENCOUNTER — Other Ambulatory Visit: Payer: Self-pay

## 2024-05-16 ENCOUNTER — Other Ambulatory Visit: Payer: Self-pay

## 2024-05-17 ENCOUNTER — Other Ambulatory Visit (HOSPITAL_COMMUNITY): Payer: Self-pay

## 2024-05-17 ENCOUNTER — Other Ambulatory Visit: Payer: Self-pay

## 2024-05-18 ENCOUNTER — Other Ambulatory Visit (HOSPITAL_COMMUNITY): Payer: Self-pay

## 2024-05-22 ENCOUNTER — Other Ambulatory Visit (HOSPITAL_COMMUNITY): Payer: Self-pay

## 2024-05-22 ENCOUNTER — Other Ambulatory Visit: Payer: Self-pay | Admitting: Physician Assistant

## 2024-05-22 DIAGNOSIS — E119 Type 2 diabetes mellitus without complications: Secondary | ICD-10-CM

## 2024-05-22 DIAGNOSIS — I10 Essential (primary) hypertension: Secondary | ICD-10-CM

## 2024-05-23 ENCOUNTER — Other Ambulatory Visit (HOSPITAL_COMMUNITY): Payer: Self-pay

## 2024-05-23 ENCOUNTER — Other Ambulatory Visit: Payer: Self-pay

## 2024-05-23 MED ORDER — METFORMIN HCL 500 MG PO TABS
500.0000 mg | ORAL_TABLET | Freq: Two times a day (BID) | ORAL | 1 refills | Status: AC
  Filled 2024-05-23: qty 180, 90d supply, fill #0
  Filled 2024-08-23: qty 180, 90d supply, fill #1

## 2024-05-23 MED ORDER — LOSARTAN POTASSIUM 100 MG PO TABS
100.0000 mg | ORAL_TABLET | Freq: Every day | ORAL | 1 refills | Status: DC
  Filled 2024-05-23: qty 90, 90d supply, fill #0
  Filled 2024-08-19: qty 90, 90d supply, fill #1

## 2024-05-23 NOTE — Telephone Encounter (Signed)
 Requested Prescriptions  Pending Prescriptions Disp Refills   losartan  (COZAAR ) 100 MG tablet 90 tablet 1    Sig: Take 1 tablet (100 mg total) by mouth daily.     Cardiovascular:  Angiotensin Receptor Blockers Passed - 05/23/2024  4:15 PM      Passed - Cr in normal range and within 180 days    Creat  Date Value Ref Range Status  01/15/2022 0.74 0.50 - 1.03 mg/dL Final   Creatinine, Ser  Date Value Ref Range Status  03/30/2024 0.62 0.57 - 1.00 mg/dL Final         Passed - K in normal range and within 180 days    Potassium  Date Value Ref Range Status  03/30/2024 4.3 3.5 - 5.2 mmol/L Final  09/17/2014 4.3 3.5 - 5.1 mmol/L Final         Passed - Patient is not pregnant      Passed - Last BP in normal range    BP Readings from Last 1 Encounters:  03/14/24 127/84         Passed - Valid encounter within last 6 months    Recent Outpatient Visits           2 months ago Type 2 diabetes mellitus without complication, without long-term current use of insulin (HCC)   [place] [facility] [place], Janna, PA-C               metFORMIN  (GLUCOPHAGE ) 500 MG tablet 180 tablet 1    Sig: Take 1 tablet (500 mg total) by mouth 2 (two) times daily with a meal.     Endocrinology:  Diabetes - Biguanides Failed - 05/23/2024  4:15 PM      Failed - B12 Level in normal range and within 720 days    No results found for: VITAMINB12       Passed - Cr in normal range and within 360 days    Creat  Date Value Ref Range Status  01/15/2022 0.74 0.50 - 1.03 mg/dL Final   Creatinine, Ser  Date Value Ref Range Status  03/30/2024 0.62 0.57 - 1.00 mg/dL Final         Passed - HBA1C is between 0 and 7.9 and within 180 days    Hgb A1c MFr Bld  Date Value Ref Range Status  03/30/2024 5.2 4.8 - 5.6 % Final    Comment:             Prediabetes: 5.7 - 6.4          Diabetes: >6.4          Glycemic control for adults with diabetes: <7.0          Passed - eGFR in normal range and  within 360 days    EGFR (African American)  Date Value Ref Range Status  09/17/2014 >60 >67mL/min Final   GFR calc Af Amer  Date Value Ref Range Status  05/13/2019 >60 >60 mL/min Final   EGFR (Non-African Amer.)  Date Value Ref Range Status  09/17/2014 >60 >6mL/min Final    Comment:    eGFR values <60mL/min/1.73 m2 may be an indication of chronic kidney disease (CKD). Calculated eGFR, using the MRDR Study equation, is useful in  patients with stable renal function. The eGFR calculation will not be reliable in acutely ill patients when serum creatinine is changing rapidly. It is not useful in patients on dialysis. The eGFR calculation may not be applicable to patients at the low  and high extremes of body sizes, pregnant women, and vegetarians.    GFR, Estimated  Date Value Ref Range Status  05/06/2021 >60 >60 mL/min Final    Comment:    (NOTE) Calculated using the CKD-EPI Creatinine Equation (2021)    eGFR  Date Value Ref Range Status  03/30/2024 105 >59 mL/min/1.73 Final         Passed - Valid encounter within last 6 months    Recent Outpatient Visits           2 months ago Type 2 diabetes mellitus without complication, without long-term current use of insulin (HCC)   [place] [facility], [place], PA-C              Passed - CBC within normal limits and completed in the last 12 months    WBC  Date Value Ref Range Status  03/30/2024 10.1 3.4 - 10.8 x10E3/uL Final  01/15/2022 8.3 3.8 - 10.8 Thousand/uL Final   RBC  Date Value Ref Range Status  03/30/2024 4.30 3.77 - 5.28 x10E6/uL Final  01/15/2022 4.62 3.80 - 5.10 Million/uL Final   Hemoglobin  Date Value Ref Range Status  03/30/2024 13.0 11.1 - 15.9 g/dL Final   Hematocrit  Date Value Ref Range Status  03/30/2024 39.6 34.0 - 46.6 % Final   MCHC  Date Value Ref Range Status  03/30/2024 32.8 31.5 - 35.7 g/dL Final  96/97/7976 66.3 32.0 - 36.0 g/dL Final   [facility]  Date Value  Ref Range Status  03/30/2024 30.2 26.6 - 33.0 pg Final  01/15/2022 29.9 27.0 - 33.0 pg Final   MCV  Date Value Ref Range Status  03/30/2024 92 79 - 97 fL Final  09/17/2014 91 80 - 100 fL Final   No results found for: PLTCOUNTKUC, LABPLAT, POCPLA RDW  Date Value Ref Range Status  03/30/2024 14.1 11.7 - 15.4 % Final  09/17/2014 14.7 (H) 11.5 - 14.5 % Final

## 2024-05-31 ENCOUNTER — Other Ambulatory Visit: Payer: Self-pay

## 2024-05-31 ENCOUNTER — Other Ambulatory Visit (HOSPITAL_COMMUNITY): Payer: Self-pay

## 2024-06-06 ENCOUNTER — Other Ambulatory Visit: Payer: Self-pay

## 2024-06-06 ENCOUNTER — Telehealth: Payer: Self-pay | Admitting: Physician Assistant

## 2024-06-06 ENCOUNTER — Other Ambulatory Visit (HOSPITAL_COMMUNITY): Payer: Self-pay

## 2024-06-06 DIAGNOSIS — E1169 Type 2 diabetes mellitus with other specified complication: Secondary | ICD-10-CM

## 2024-06-06 MED ORDER — ROSUVASTATIN CALCIUM 20 MG PO TABS
20.0000 mg | ORAL_TABLET | Freq: Every day | ORAL | 3 refills | Status: AC
  Filled 2024-06-06 – 2024-06-07 (×2): qty 90, 90d supply, fill #0
  Filled 2024-08-23: qty 90, 90d supply, fill #1
  Filled 2024-08-31 – 2024-11-10 (×2): qty 90, 90d supply, fill #2

## 2024-06-06 NOTE — Telephone Encounter (Unsigned)
 Copied from CRM [id number]. Topic: Clinical - Prescription Issue >> Jun 06, 2024  3:00 PM Donee H wrote: Reason for CRM: Patient called to state there is an issue getting insurance to cover medication rosuvastatin  (CRESTOR ) 10 MG tablet. She states pharmacy advised her insurance will not cover medication due to her having to take 2 tablets a day. She is not 100% sure but she think medication that was suggested by pharmacy was [place]  20mg  which insurance will cover.  Being she is not 100% sure of name pharmacy provided to her she is asking if there are any alternative medications for cholesterol that can be prescribed to her. Request a callback to follow up on request [phone]

## 2024-06-07 ENCOUNTER — Other Ambulatory Visit: Payer: Self-pay

## 2024-06-07 ENCOUNTER — Other Ambulatory Visit (HOSPITAL_COMMUNITY): Payer: Self-pay

## 2024-06-08 ENCOUNTER — Other Ambulatory Visit: Payer: Self-pay

## 2024-06-19 ENCOUNTER — Other Ambulatory Visit (HOSPITAL_BASED_OUTPATIENT_CLINIC_OR_DEPARTMENT_OTHER): Payer: Self-pay

## 2024-06-19 ENCOUNTER — Other Ambulatory Visit: Payer: Self-pay | Admitting: Physician Assistant

## 2024-06-19 ENCOUNTER — Other Ambulatory Visit: Payer: Self-pay

## 2024-06-19 DIAGNOSIS — I10 Essential (primary) hypertension: Secondary | ICD-10-CM

## 2024-06-19 MED ORDER — AMLODIPINE BESYLATE 10 MG PO TABS
10.0000 mg | ORAL_TABLET | Freq: Every day | ORAL | 1 refills | Status: DC
  Filled 2024-06-19: qty 90, 90d supply, fill #0
  Filled 2024-09-12: qty 90, 90d supply, fill #1

## 2024-07-05 ENCOUNTER — Ambulatory Visit (INDEPENDENT_AMBULATORY_CARE_PROVIDER_SITE_OTHER)

## 2024-07-05 DIAGNOSIS — Z0000 Encounter for general adult medical examination without abnormal findings: Secondary | ICD-10-CM

## 2024-07-05 NOTE — Progress Notes (Signed)
 Subjective:   Denise Velez is a 58 y.o. who presents for a Medicare Wellness preventive visit.  As a reminder, Annual Wellness Visits don't include a physical exam, and some assessments may be limited, especially if this visit is performed virtually. We may recommend an in-person follow-up visit with your provider if needed.  Visit Complete: Virtual I connected with  Denise Velez on 07/05/24 by a audio enabled telemedicine application and verified that I am speaking with the correct person using two identifiers.  Patient Location: Home  Provider Location: Home Office  I discussed the limitations of evaluation and management by telemedicine. The patient expressed understanding and agreed to proceed.  Vital Signs: Because this visit was a virtual/telehealth visit, some criteria may be missing or patient reported. Any vitals not documented were not able to be obtained and vitals that have been documented are patient reported.  VideoDeclined- This patient declined Librarian, academic. Therefore the visit was completed with audio only.  Persons Participating in Visit: Patient.  AWV Questionnaire: No: Patient Medicare AWV questionnaire was not completed prior to this visit.  Cardiac Risk Factors include: hypertension;diabetes mellitus;dyslipidemia;obesity (BMI >30kg/m2);smoking/ tobacco exposure     Objective:    There were no vitals filed for this visit. There is no height or weight on file to calculate BMI.     07/05/2024    8:57 AM 06/02/2023    1:49 PM 05/03/2023   11:51 AM 02/19/2022    1:33 PM 05/02/2021    9:48 AM 10/03/2020    8:27 PM 05/13/2019    1:08 PM  Advanced Directives  Does Patient Have a Medical Advance Directive? No No No No No No No  Would patient like information on creating a medical advance directive? No - Patient declined No - Patient declined No - Patient declined No - Patient declined  No - Patient declined No - Patient declined       Data saved with a previous flowsheet row definition    Current Medications (verified) Outpatient Encounter Medications as of 07/05/2024  Medication Sig   Accu-Chek Softclix Lancets lancets Use to check fasting blood sugars daily at as needed throughout the day   amLODipine  (NORVASC ) 10 MG tablet Take 1 tablet (10 mg total) by mouth daily.   blood glucose meter kit and supplies KIT Dispense based on patient and insurance preference. Check blood sugar twice a day   dapagliflozin  propanediol (FARXIGA ) 5 MG TABS tablet Take 1 tablet (5 mg total) by mouth daily.   glucose blood test strip Use to check fasting blood sugars daily and as needed throughout the day   losartan  (COZAAR ) 100 MG tablet Take 1 tablet (100 mg total) by mouth daily.   metFORMIN  (GLUCOPHAGE ) 500 MG tablet Take 1 tablet (500 mg total) by mouth 2 (two) times daily with a meal.   rosuvastatin  (CRESTOR ) 20 MG tablet Take 1 tablet (20 mg total) by mouth daily.   tirzepatide  (MOUNJARO ) 15 MG/0.5ML Pen Inject 15 mg into the skin once a week.   [DISCONTINUED] tiZANidine  (ZANAFLEX ) 2 MG tablet Take 1-2 tablets (2-4 mg total) by mouth every 12 (twelve) hours as needed for muscle spasms.   No facility-administered encounter medications on file as of 07/05/2024.    Allergies (verified) Sulfa antibiotics   History: Past Medical History:  Diagnosis Date   Anxiety 03/2022   Arthritis    Depression 03/2022   Been feeling down lately due to mobility and not being able to  do the things i use to do.   Diabetes mellitus without complication (HCC)    Hypertension    Osteoporosis    Past Surgical History:  Procedure Laterality Date   BREAST BIOPSY Left 01/20/2023   stereo bx, calcs, X clip-BENIGN MAMMARY PARENCHYMA WITH FIBROCYSTIC AND COLUMNAR CELL CHANGES,WITH ASSOCIATED DYSTROPHIC CALCIFICATIONS AND FOCAL SCLEROSING ADENOSIS.- NEGATIVE FOR ATYPICAL PROLIFERATIVE BREAST DISEASE.   BREAST BIOPSY Left 01/20/2023   stereo bx,  calcs, ribbon clip-BENIGN MAMMARY PARENCHYMA WITH FIBROCYSTIC AND COLUMNAR CELL CHANGES,WITH ASSOCIATED DYSTROPHIC CALCIFICATIONS AND FOCAL SCLEROSING ADENOSIS.- NEGATIVE FOR ATYPICAL PROLIFERATIVE BREAST DISEASE.   BREAST BIOPSY Left 01/20/2023   MM LT BREAST BX W LOC DEV 1ST LESION IMAGE BX SPEC STEREO GUIDE 01/20/2023 [facility]   BREAST BIOPSY Left 01/20/2023   MM LT BREAST BX W LOC DEV EA AD LESION IMG BX SPEC STEREO GUIDE 01/20/2023 [facility]   CESAREAN SECTION     times 2   KNEE ARTHROSCOPY WITH MEDIAL MENISECTOMY Right 05/08/2021   Procedure: RIGHT KNEE ARTHROSCOPY WITH MEDIAL MENISECTOMY;  Surgeon: Marchia Drivers, MD;  Location: [facility];  Service: Orthopedics;  Laterality: Right;   TUBAL LIGATION  04/1993   Family History  Problem Relation Age of Onset   Stroke Mother    Kidney disease Mother    Hypertension Mother    Diabetes Mother    Arthritis Mother    Hypertension Brother    Diabetes Brother    Diabetes Daughter    Hypertension Daughter    Obesity Daughter    Social History   Socioeconomic History   Marital status: Divorced    Spouse name: Not on file   Number of children: 3   Years of education: Not on file   Highest education level: Associate degree: academic program  Occupational History   Not on file  Tobacco Use   Smoking status: Former    Current packs/day: 0.00    Average packs/day: 0.5 packs/day for 5.0 years (2.5 ttl pk-yrs)    Types: Cigarettes    Start date: 01/19/2017    Quit date: 01/19/2022    Years since quitting: 2.4   Smokeless tobacco: Never  Vaping Use   Vaping status: Never Used  Substance and Sexual Activity   Alcohol use: Not Currently   Drug use: Not Currently    Types: Marijuana    Comment: occ   Sexual activity: Yes    Birth control/protection: None  Other Topics Concern   Not on file  Social History Narrative   Not on file   Social Drivers of Health   Financial Resource Strain: Low Risk  (07/05/2024)   Overall  Financial Resource Strain (CARDIA)    Difficulty of Paying Living Expenses: Not very hard  Food Insecurity: No Food Insecurity (07/05/2024)   Hunger Vital Sign    Worried About Running Out of Food in the Last Year: Never true    Ran Out of Food in the Last Year: Never true  Transportation Needs: No Transportation Needs (07/05/2024)   PRAPARE - Administrator, Civil Service (Medical): No    Lack of Transportation (Non-Medical): No  Physical Activity: Sufficiently Active (07/05/2024)   Exercise Vital Sign    Days of Exercise per Week: 3 days    Minutes of Exercise per Session: 50 min  Recent Concern: Physical Activity - Insufficiently Active (07/01/2024)   Exercise Vital Sign    Days of Exercise per Week: 3 days    Minutes of Exercise per Session: 20 min  Stress: No Stress Concern Present (07/05/2024)   [employer] of Occupational Health - Occupational Stress Questionnaire    Feeling of Stress: Only a little  Social Connections: Moderately Integrated (07/05/2024)   Social Connection and Isolation Panel    Frequency of Communication with Friends and Family: More than three times a week    Frequency of Social Gatherings with Friends and Family: Three times a week    Attends Religious Services: Patient declined    Active Member of Clubs or Organizations: Yes    Attends Banker Meetings: 1 to 4 times per year    Marital Status: Living with partner    Tobacco Counseling Counseling given: Not Answered    Clinical Intake:  Pre-visit preparation completed: Yes  Pain : No/denies pain     BMI - recorded: 35.6 Nutritional Status: BMI > 30  Obese Nutritional Risks: None Diabetes: Yes CBG done?: No Did pt. bring in CBG monitor from home?: No  Lab Results  Component Value Date   HGBA1C 5.2 03/30/2024   HGBA1C 5.9 (A) 07/15/2023   HGBA1C 5.5 02/16/2023     How often do you need to have someone help you when you read instructions, pamphlets, or other  written materials from your doctor or pharmacy?: 1 - Never  Interpreter Needed?: No  Information entered by :: Denise DAS, LPN   Activities of Daily Living    07/05/2024    8:59 AM 07/01/2024    8:56 AM  In your present state of health, do you have any difficulty performing the following activities:  Hearing? 0 0  Vision? 0 0  Difficulty concentrating or making decisions? 0 0  Walking or climbing stairs? 1 1  Dressing or bathing? 0 0  Doing errands, shopping? 0 0  Preparing Food and eating ? N N  Using the Toilet? N N  In the past six months, have you accidently leaked urine? N N  Do you have problems with loss of bowel control? N N  Managing your Medications? N N  Managing your Finances? N N  Housekeeping or managing your Housekeeping? N N    Patient Care Team: Ostwalt, Janna, PA-C as PCP - General (Physician Assistant) Pa, [facility] (Optometry)  I have updated your Care Teams any recent Medical Services you may have received from other providers in the past year.     Assessment:   This is a routine wellness examination for Denise Velez.  Hearing/Vision screen Hearing Screening - Comments:: NO AIDS Vision Screening - Comments:: WEARS GLASSES ALL DAY- [facility]- NEEDS EYE APPT   Goals Addressed             This Visit's Progress    DIET - EAT MORE FRUITS AND VEGETABLES         Depression Screen     07/05/2024    8:55 AM 03/14/2024    1:28 PM 08/26/2023   10:41 AM 07/15/2023    1:41 PM 05/26/2022   11:23 AM 02/19/2022    1:34 PM 01/15/2022   11:36 AM  PHQ 2/9 Scores  PHQ - 2 Score 0 1 0 0 4 0 1  PHQ- 9 Score 0 1   17      Fall Risk     07/01/2024    8:56 AM 07/15/2023    1:41 PM 06/02/2023    1:49 PM 05/03/2023   11:51 AM 04/13/2023    1:48 PM  Fall Risk   Falls in the past year?  0 0 0 0 0  Number falls in past yr: 0    0  Injury with Fall? 0 0     Risk for fall due to : No Fall Risks No Fall Risks   No Fall Risks  Follow up Falls evaluation  completed;Falls prevention discussed Falls evaluation completed   Falls evaluation completed    MEDICARE RISK AT HOME:  Medicare Risk at Home Any stairs in or around the home?: No If so, are there any without handrails?: No Home free of loose throw rugs in walkways, pet beds, electrical cords, etc?: Yes Adequate lighting in your home to reduce risk of falls?: Yes Life alert?: No Use of a cane, walker or w/c?: Yes (CANE OCCASIONALLY) Grab bars in the bathroom?: Yes Shower chair or bench in shower?: No Elevated toilet seat or a handicapped toilet?: Yes  TIMED UP AND GO:  Was the test performed?  No  Cognitive Function: 6CIT completed        07/05/2024    9:01 AM  6CIT Screen  What Year? 0 points  What month? 0 points  What time? 0 points  Count back from 20 0 points  Months in reverse 0 points  Repeat phrase 0 points  Total Score 0 points    Immunizations Immunization History  Administered Date(s) Administered   Hepatitis A 03/31/2005, 05/11/2005, 03/09/2006   Hepatitis B 03/31/2005, 05/11/2005, 03/09/2006   Influenza, Seasonal, Injecte, Preservative Fre 07/15/2023   Influenza,inj,Quad PF,6+ Mos 08/26/2022   MMR 06/30/2007, 08/05/2010   Moderna Sars-Covid-2 Vaccination 12/22/2019, 01/19/2020   PPD Test 04/05/2020   Pfizer(Comirnaty)Fall Seasonal Vaccine 12 years and older 01/13/2023   Td 06/30/2007, 05/26/2022   Tdap 03/09/2006   Zoster Recombinant(Shingrix ) 05/26/2022    Screening Tests Health Maintenance  Topic Date Due   HIV Screening  Never done   Pneumococcal Vaccine: 50+ Years (1 of 2 - PCV) Never done   Zoster Vaccines- Shingrix  (2 of 2) 07/21/2022   COVID-19 Vaccine (4 - 2024-25 season) 07/18/2023   FOOT EXAM  08/27/2023   INFLUENZA VACCINE  06/16/2024   Diabetic kidney evaluation - Urine ACR  07/14/2024   OPHTHALMOLOGY EXAM  07/21/2024   HEMOGLOBIN A1C  09/30/2024   Diabetic kidney evaluation - eGFR measurement  03/30/2025   MAMMOGRAM  03/30/2025    Fecal DNA (Cologuard)  06/04/2025   Medicare Annual Wellness (AWV)  07/05/2025   Cervical Cancer Screening (HPV/Pap Cotest)  07/17/2027   DTaP/Tdap/Td (4 - Td or Tdap) 05/26/2032   Hepatitis B Vaccines 19-59 Average Risk  Completed   Hepatitis C Screening  Completed   HPV VACCINES  Aged Out   Meningococcal B Vaccine  Aged Out    Health Maintenance  Health Maintenance Due  Topic Date Due   HIV Screening  Never done   Pneumococcal Vaccine: 50+ Years (1 of 2 - PCV) Never done   Zoster Vaccines- Shingrix  (2 of 2) 07/21/2022   COVID-19 Vaccine (4 - 2024-25 season) 07/18/2023   FOOT EXAM  08/27/2023   INFLUENZA VACCINE  06/16/2024   Diabetic kidney evaluation - Urine ACR  07/14/2024   Health Maintenance Items Addressed: NEEDS COLONOSCOPY BUT DECLINED REFERRAL; UP TO DATE W/ TDAP & COVID, NEEDS 2ND SHINGRIX   Additional Screening:  Vision Screening: Recommended annual ophthalmology exams for early detection of glaucoma and other disorders of the eye. Would you like a referral to an eye doctor? No    Dental Screening: Recommended annual dental exams for proper oral hygiene  Community Resource Referral / Chronic Care Management: CRR required this visit?  No   CCM required this visit?  No   Plan:    I have personally reviewed and noted the following in the patient's chart:   Medical and social history Use of alcohol, tobacco or illicit drugs  Current medications and supplements including opioid prescriptions. Patient is not currently taking opioid prescriptions. Functional ability and status Nutritional status Physical activity Advanced directives List of other physicians Hospitalizations, surgeries, and ER visits in previous 12 months Vitals Screenings to include cognitive, depression, and falls Referrals and appointments  In addition, I have reviewed and discussed with patient certain preventive protocols, quality metrics, and best practice recommendations. A written  personalized care plan for preventive services as well as general preventive health recommendations were provided to patient.   Denise GORMAN Das, LPN   [id number]   After Visit Summary: (MyChart) Due to this being a telephonic visit, the after visit summary with patients personalized plan was offered to patient via MyChart   Notes: Nothing significant to report at this time.

## 2024-07-05 NOTE — Patient Instructions (Signed)
 [name] , Thank you for taking time out of your busy schedule to complete your Annual Wellness Visit with me. I enjoyed our conversation and look forward to speaking with you again next year. I, as well as your care team,  appreciate your ongoing commitment to your health goals. Please review the following plan we discussed and let me know if I can assist you in the future.  Follow up Visits: 07/11/25 @ 8:50 AM BY PHONE We will see or speak with you next year for your Next Medicare AWV with our clinical staff Have you seen your provider in the last 6 months (3 months if uncontrolled diabetes)? Yes  Clinician Recommendations:  Aim for 30 minutes of exercise or brisk walking, 6-8 glasses of water, and 5 servings of fruits and vegetables each day. TAKE CARE!      This is a list of the screenings recommended for you:  Health Maintenance  Topic Date Due   HIV Screening  Never done   Pneumococcal Vaccine for age over 47 (1 of 2 - PCV) Never done   Zoster (Shingles) Vaccine (2 of 2) 07/21/2022   COVID-19 Vaccine (4 - 2024-25 season) 07/18/2023   Complete foot exam   08/27/2023   Flu Shot  06/16/2024   Yearly kidney health urinalysis for diabetes  07/14/2024   Eye exam for diabetics  07/21/2024   Hemoglobin A1C  09/30/2024   Yearly kidney function blood test for diabetes  03/30/2025   Mammogram  03/30/2025   Cologuard (Stool DNA test)  06/04/2025   Medicare Annual Wellness Visit  07/05/2025   Pap with HPV screening  07/17/2027   DTaP/Tdap/Td vaccine (4 - Td or Tdap) 05/26/2032   Hepatitis B Vaccine  Completed   Hepatitis C Screening  Completed   HPV Vaccine  Aged Out   Meningitis B Vaccine  Aged Out    Advanced directives: (ACP Link)Information on Advanced Care Planning can be found at [place]  Print production planner Health Care Directives Advance Health Care Directives. [email or web address]  Advance Care Planning is important because it:  [x]  Makes sure you receive the medical care that  is consistent with your values, goals, and preferences  [x]  It provides guidance to your family and loved ones and reduces their decisional burden about whether or not they are making the right decisions based on your wishes.  Follow the link provided in your after visit summary or read over the paperwork we have mailed to you to help you started getting your Advance Directives in place. If you need assistance in completing these, please reach out to us  so that we can help you!

## 2024-08-07 ENCOUNTER — Other Ambulatory Visit: Payer: Self-pay | Admitting: Physician Assistant

## 2024-08-07 DIAGNOSIS — E119 Type 2 diabetes mellitus without complications: Secondary | ICD-10-CM

## 2024-08-09 ENCOUNTER — Other Ambulatory Visit (HOSPITAL_COMMUNITY): Payer: Self-pay

## 2024-08-09 ENCOUNTER — Other Ambulatory Visit: Payer: Self-pay

## 2024-08-09 MED ORDER — MOUNJARO 15 MG/0.5ML ~~LOC~~ SOAJ
15.0000 mg | SUBCUTANEOUS | 1 refills | Status: AC
  Filled 2024-08-09: qty 6, 84d supply, fill #0
  Filled 2024-10-18 – 2024-10-19 (×2): qty 6, 84d supply, fill #1

## 2024-08-23 ENCOUNTER — Other Ambulatory Visit: Payer: Self-pay | Admitting: Physician Assistant

## 2024-08-23 DIAGNOSIS — E119 Type 2 diabetes mellitus without complications: Secondary | ICD-10-CM

## 2024-08-24 ENCOUNTER — Other Ambulatory Visit: Payer: Self-pay

## 2024-08-24 ENCOUNTER — Other Ambulatory Visit (HOSPITAL_COMMUNITY): Payer: Self-pay

## 2024-08-24 MED ORDER — DAPAGLIFLOZIN PROPANEDIOL 5 MG PO TABS
5.0000 mg | ORAL_TABLET | Freq: Every day | ORAL | 1 refills | Status: AC
  Filled 2024-08-24: qty 90, 90d supply, fill #0
  Filled 2024-11-10: qty 90, 90d supply, fill #1

## 2024-09-01 ENCOUNTER — Other Ambulatory Visit (HOSPITAL_COMMUNITY): Payer: Self-pay

## 2024-10-19 ENCOUNTER — Other Ambulatory Visit (HOSPITAL_COMMUNITY): Payer: Self-pay

## 2024-11-10 ENCOUNTER — Other Ambulatory Visit: Payer: Self-pay | Admitting: Physician Assistant

## 2024-11-10 DIAGNOSIS — I10 Essential (primary) hypertension: Secondary | ICD-10-CM

## 2024-11-11 ENCOUNTER — Other Ambulatory Visit: Payer: Self-pay

## 2024-11-13 ENCOUNTER — Other Ambulatory Visit (HOSPITAL_COMMUNITY): Payer: Self-pay

## 2024-11-13 MED ORDER — LOSARTAN POTASSIUM 100 MG PO TABS
100.0000 mg | ORAL_TABLET | Freq: Every day | ORAL | 0 refills | Status: AC
  Filled 2024-11-13 – 2024-11-14 (×2): qty 90, 90d supply, fill #0

## 2024-11-13 MED ORDER — AMLODIPINE BESYLATE 10 MG PO TABS
10.0000 mg | ORAL_TABLET | Freq: Every day | ORAL | 0 refills | Status: AC
  Filled 2024-11-13 – 2024-12-10 (×3): qty 90, 90d supply, fill #0

## 2024-11-14 ENCOUNTER — Other Ambulatory Visit (HOSPITAL_COMMUNITY): Payer: Self-pay

## 2024-11-14 ENCOUNTER — Other Ambulatory Visit: Payer: Self-pay

## 2024-12-05 ENCOUNTER — Encounter: Payer: Self-pay | Admitting: Physician Assistant

## 2024-12-10 ENCOUNTER — Other Ambulatory Visit (HOSPITAL_COMMUNITY): Payer: Self-pay

## 2024-12-10 ENCOUNTER — Other Ambulatory Visit: Payer: Self-pay

## 2025-07-11 ENCOUNTER — Ambulatory Visit
